# Patient Record
Sex: Female | Born: 1965 | Race: White | Hispanic: No | Marital: Married | Smoking: Former smoker
Health system: Southern US, Community
[De-identification: ages and names within clinical notes are randomized; demographics above are authoritative.]

## PROBLEM LIST (undated history)

## (undated) DIAGNOSIS — F419 Anxiety disorder, unspecified: Secondary | ICD-10-CM

## (undated) DIAGNOSIS — F32A Depression, unspecified: Secondary | ICD-10-CM

## (undated) DIAGNOSIS — E079 Disorder of thyroid, unspecified: Secondary | ICD-10-CM

## (undated) DIAGNOSIS — C801 Malignant (primary) neoplasm, unspecified: Secondary | ICD-10-CM

## (undated) DIAGNOSIS — F329 Major depressive disorder, single episode, unspecified: Secondary | ICD-10-CM

## (undated) DIAGNOSIS — E039 Hypothyroidism, unspecified: Secondary | ICD-10-CM

## (undated) HISTORY — PX: TONSILECTOMY, ADENOIDECTOMY, BILATERAL MYRINGOTOMY AND TUBES: SHX2538

## (undated) HISTORY — PX: TONSILLECTOMY: SUR1361

## (undated) HISTORY — DX: Disorder of thyroid, unspecified: E07.9

---

## 1999-12-28 ENCOUNTER — Ambulatory Visit (HOSPITAL_COMMUNITY): Admission: RE | Admit: 1999-12-28 | Discharge: 1999-12-28 | Payer: Self-pay | Admitting: *Deleted

## 1999-12-28 ENCOUNTER — Encounter: Payer: Self-pay | Admitting: *Deleted

## 2000-01-02 ENCOUNTER — Encounter: Payer: Self-pay | Admitting: *Deleted

## 2000-01-02 ENCOUNTER — Inpatient Hospital Stay (HOSPITAL_COMMUNITY): Admission: AD | Admit: 2000-01-02 | Discharge: 2000-01-02 | Payer: Self-pay | Admitting: Obstetrics and Gynecology

## 2000-01-04 ENCOUNTER — Inpatient Hospital Stay (HOSPITAL_COMMUNITY): Admission: AD | Admit: 2000-01-04 | Discharge: 2000-01-04 | Payer: Self-pay | Admitting: Obstetrics & Gynecology

## 2000-01-17 ENCOUNTER — Other Ambulatory Visit: Admission: RE | Admit: 2000-01-17 | Discharge: 2000-01-17 | Payer: Self-pay | Admitting: Obstetrics and Gynecology

## 2000-07-09 ENCOUNTER — Inpatient Hospital Stay (HOSPITAL_COMMUNITY): Admission: AD | Admit: 2000-07-09 | Discharge: 2000-07-09 | Payer: Self-pay

## 2000-07-10 ENCOUNTER — Inpatient Hospital Stay (HOSPITAL_COMMUNITY): Admission: AD | Admit: 2000-07-10 | Discharge: 2000-07-10 | Payer: Self-pay | Admitting: Obstetrics and Gynecology

## 2000-08-13 ENCOUNTER — Inpatient Hospital Stay (HOSPITAL_COMMUNITY): Admission: AD | Admit: 2000-08-13 | Discharge: 2000-08-13 | Payer: Self-pay | Admitting: Urology

## 2000-09-30 ENCOUNTER — Inpatient Hospital Stay (HOSPITAL_COMMUNITY): Admission: AD | Admit: 2000-09-30 | Discharge: 2000-10-02 | Payer: Self-pay | Admitting: *Deleted

## 2000-10-03 ENCOUNTER — Encounter: Admission: RE | Admit: 2000-10-03 | Discharge: 2001-01-01 | Payer: Self-pay | Admitting: Obstetrics and Gynecology

## 2000-11-06 ENCOUNTER — Other Ambulatory Visit: Admission: RE | Admit: 2000-11-06 | Discharge: 2000-11-06 | Payer: Self-pay | Admitting: Obstetrics and Gynecology

## 2001-10-06 ENCOUNTER — Other Ambulatory Visit: Admission: RE | Admit: 2001-10-06 | Discharge: 2001-10-06 | Payer: Self-pay | Admitting: Obstetrics and Gynecology

## 2002-01-14 ENCOUNTER — Inpatient Hospital Stay (HOSPITAL_COMMUNITY): Admission: AD | Admit: 2002-01-14 | Discharge: 2002-01-14 | Payer: Self-pay | Admitting: Obstetrics and Gynecology

## 2002-01-15 ENCOUNTER — Inpatient Hospital Stay (HOSPITAL_COMMUNITY): Admission: AD | Admit: 2002-01-15 | Discharge: 2002-01-15 | Payer: Self-pay | Admitting: Obstetrics and Gynecology

## 2002-01-23 ENCOUNTER — Inpatient Hospital Stay (HOSPITAL_COMMUNITY): Admission: AD | Admit: 2002-01-23 | Discharge: 2002-01-23 | Payer: Self-pay | Admitting: Obstetrics and Gynecology

## 2002-03-10 ENCOUNTER — Inpatient Hospital Stay (HOSPITAL_COMMUNITY): Admission: AD | Admit: 2002-03-10 | Discharge: 2002-03-10 | Payer: Self-pay | Admitting: Obstetrics and Gynecology

## 2002-04-11 ENCOUNTER — Inpatient Hospital Stay (HOSPITAL_COMMUNITY): Admission: AD | Admit: 2002-04-11 | Discharge: 2002-04-11 | Payer: Self-pay | Admitting: Obstetrics and Gynecology

## 2002-04-18 ENCOUNTER — Inpatient Hospital Stay (HOSPITAL_COMMUNITY): Admission: AD | Admit: 2002-04-18 | Discharge: 2002-04-20 | Payer: Self-pay | Admitting: Obstetrics and Gynecology

## 2002-05-29 ENCOUNTER — Other Ambulatory Visit: Admission: RE | Admit: 2002-05-29 | Discharge: 2002-05-29 | Payer: Self-pay | Admitting: Obstetrics and Gynecology

## 2003-06-10 ENCOUNTER — Other Ambulatory Visit: Admission: RE | Admit: 2003-06-10 | Discharge: 2003-06-10 | Payer: Self-pay | Admitting: Obstetrics and Gynecology

## 2015-10-30 HISTORY — PX: MASTECTOMY: SHX3

## 2016-01-23 ENCOUNTER — Other Ambulatory Visit: Payer: Self-pay | Admitting: Internal Medicine

## 2016-02-17 ENCOUNTER — Other Ambulatory Visit: Payer: Self-pay | Admitting: Obstetrics and Gynecology

## 2016-02-17 DIAGNOSIS — R928 Other abnormal and inconclusive findings on diagnostic imaging of breast: Secondary | ICD-10-CM

## 2016-02-20 ENCOUNTER — Other Ambulatory Visit: Payer: Self-pay | Admitting: Obstetrics and Gynecology

## 2016-02-20 ENCOUNTER — Ambulatory Visit
Admission: RE | Admit: 2016-02-20 | Discharge: 2016-02-20 | Disposition: A | Discharge status: Home / Self Care | Payer: Commercial Managed Care - PPO | Source: Ambulatory Visit | Attending: Obstetrics and Gynecology | Admitting: Obstetrics and Gynecology

## 2016-02-20 DIAGNOSIS — R928 Other abnormal and inconclusive findings on diagnostic imaging of breast: Secondary | ICD-10-CM

## 2016-02-24 ENCOUNTER — Ambulatory Visit
Admission: RE | Admit: 2016-02-24 | Discharge: 2016-02-24 | Disposition: A | Discharge status: Home / Self Care | Payer: Commercial Managed Care - PPO | Source: Ambulatory Visit | Attending: Obstetrics and Gynecology | Admitting: Obstetrics and Gynecology

## 2016-02-24 ENCOUNTER — Other Ambulatory Visit: Payer: Self-pay | Admitting: Obstetrics and Gynecology

## 2016-02-24 DIAGNOSIS — R928 Other abnormal and inconclusive findings on diagnostic imaging of breast: Secondary | ICD-10-CM

## 2016-02-27 ENCOUNTER — Telehealth: Payer: Self-pay

## 2016-02-27 NOTE — Telephone Encounter (Signed)
RESULTS

## 2016-03-01 ENCOUNTER — Telehealth: Payer: Self-pay | Admitting: Hematology

## 2016-03-01 ENCOUNTER — Encounter: Payer: Self-pay | Admitting: Hematology

## 2016-03-01 NOTE — Telephone Encounter (Signed)
Spoke with patient re new patient appointment with YF 5/8 @ 11 am to arrive 10:30 am. Patient demographic and insurance information confirmed.

## 2016-03-05 ENCOUNTER — Ambulatory Visit: Payer: Commercial Managed Care - PPO | Admitting: Hematology

## 2016-03-09 ENCOUNTER — Encounter: Payer: Self-pay | Admitting: Hematology

## 2016-03-09 ENCOUNTER — Ambulatory Visit (HOSPITAL_BASED_OUTPATIENT_CLINIC_OR_DEPARTMENT_OTHER): Payer: Commercial Managed Care - PPO | Admitting: Hematology

## 2016-03-09 VITALS — BP 114/48 | HR 84 | Temp 98.0°F | Resp 18 | Ht 60.0 in | Wt 155.3 lb

## 2016-03-09 DIAGNOSIS — D0511 Intraductal carcinoma in situ of right breast: Secondary | ICD-10-CM | POA: Diagnosis not present

## 2016-03-09 DIAGNOSIS — Z170 Estrogen receptor positive status [ER+]: Secondary | ICD-10-CM

## 2016-03-09 DIAGNOSIS — C50511 Malignant neoplasm of lower-outer quadrant of right female breast: Secondary | ICD-10-CM

## 2016-03-09 NOTE — Progress Notes (Signed)
[place]  [phone] Fax:([phone]) [place] Note   Patient Care Team: Lona Kettle, MD as PCP - General (Family Medicine) 03/09/2016  Referring physician: Dr. Marlou Starks   CHIEF COMPLAINTS/PURPOSE OF CONSULTATION:  Newly diagnosed right breast DCIS    Oncology History   Breast cancer of lower-outer quadrant of right female breast [facility])   Staging form: Breast, AJCC 7th Edition     Clinical stage from 02/24/2016: Stage 0 (Tis (DCIS), N0, cM0(i+)) - Signed by Truitt Merle, MD on 03/09/2016       Breast cancer of lower-outer quadrant of right female breast ([place])   02/20/2016 Mammogram Diagnostic mammogram showed a 7 mm slightly suspicious group of calcifications within the OLQ of R breast, with a second smaller more faint group of calcification spanning 4 mm located 1.5 cm lateral.   02/24/2016 Initial Diagnosis Breast cancer of lower-outer quadrant of right female breast ([place])   02/24/2016 Receptors her2 ER 100%+, PR 75%+   02/24/2016 Initial Biopsy Right breast core needle biopsy showed ductal carcinoma in situ with calcification, intermediate to high-grade.    HISTORY OF PRESENTING ILLNESS:  Katelyn Floyd 50 y.o. female is here because of her newly diagnosed right breast DCIS. She is accompanied by her husband to the clinic today.  This was discovered by screening mammogram. She has been very compliant with screening mammogram, no prior abnormal mammogram findings or history of breast biopsy. She denies any palpable breast mass, skin change or nipple discharge. Diagnostic mammogram and ultrasound on 02/20/2016 showed a 7 mm solid a suspicious group of calcification within the outer lower quadrant of right breast, with a second smaller group of calcification 4 mm located 1.5 cm lateral. She underwent core needle biopsy on 02/24/2016, which showed DCIS, intermediate to high-grade. ER and PR positive.  She is married, with 2 children. No family  history of breast cancer. She is physically active, feels well well, denies any pain or other symptoms. No recent weight loss.  GYN HISTORY  Menarchal: 15 LMP: 05/2015  Contraceptive: no  HRT: she took for a few months  G4P2: she had micarriage and aborsion, 46 boy and 67 yo daughter    MEDICAL HISTORY:  Past Medical History  Diagnosis Date  . Thyroid disease     SURGICAL HISTORY: Past Surgical History  Procedure Laterality Date  . Tonsilectomy, adenoidectomy, bilateral myringotomy and tubes      SOCIAL HISTORY: Social History   Social History  . Marital Status: Married    Spouse Name: N/A  . Number of Children: N/A  . Years of Education: N/A   Occupational History  . Not on file.   Social History Main Topics  . Smoking status: Former Smoker -- 1.00 packs/day for 25 years    Quit date: 09/28/1998  . Smokeless tobacco: Not on file  . Alcohol Use: No  . Drug Use: No  . Sexual Activity: Not on file   Other Topics Concern  . Not on file   Social History Narrative  . 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 Lung cancer Mother 67  . Melanoma Brother     ALLERGIES:  has No Known Allergies.  MEDICATIONS:  Current Outpatient Prescriptions  Medication Sig Dispense Refill  . ALPRAZolam (XANAX) 0.25 MG tablet Take by mouth 3 (three) times daily as needed. 1/2 to 1  tab tid prn    . aspirin 81 MG chewable tablet Chew 81 mg by mouth daily.    Marland Kitchen buPROPion (WELLBUTRIN XL) 300 MG 24 hr tablet Take 300 mg by mouth daily.    . citalopram (CELEXA) 20 MG tablet Take 30 mg by mouth daily.    . Multiple Vitamin (MULTIVITAMIN) capsule Take 1 capsule by mouth daily.    Marland Kitchen thyroid (ARMOUR THYROID) 120 MG tablet Take 30 mg by mouth.    . traZODone (DESYREL) 100 MG tablet Take 150 mg by mouth at bedtime.     No current facility-administered medications for this visit.    REVIEW OF SYSTEMS:    Constitutional: Denies fevers, chills or abnormal night sweats Eyes: Denies blurriness of vision, double vision or watery eyes Ears, nose, mouth, throat, and face: Denies mucositis or sore throat Respiratory: Denies cough, dyspnea or wheezes Cardiovascular: Denies palpitation, chest discomfort or lower extremity swelling Gastrointestinal:  Denies nausea, heartburn or change in bowel habits Skin: Denies abnormal skin rashes Lymphatics: Denies new lymphadenopathy or easy bruising Neurological:Denies numbness, tingling or new weaknesses Behavioral/Psych: Mood is stable, no new changes  All other systems were reviewed with the patient and are negative.  PHYSICAL EXAMINATION: ECOG PERFORMANCE STATUS: 0 - Asymptomatic  Filed Vitals:   03/09/16 1234  BP: 114/48  Pulse: 84  Temp: 98 F (36.7 C)  Resp: 18   Filed Weights   03/09/16 1234  Weight: 155 lb 4.8 oz (70.444 kg)    GENERAL:alert, no distress and comfortable SKIN: skin color, texture, turgor are normal, no rashes or significant lesions EYES: normal, conjunctiva are pink and non-injected, sclera clear OROPHARYNX:no exudate, no erythema and lips, buccal mucosa, and tongue normal  NECK: supple, thyroid normal size, non-tender, without nodularity LYMPH:  no palpable lymphadenopathy in the cervical, axillary or inguinal LUNGS: clear to auscultation and percussion with normal breathing effort HEART: regular rate & rhythm and no murmurs and no lower extremity edema ABDOMEN:abdomen soft, non-tender and normal bowel sounds Musculoskeletal:no cyanosis of digits and no clubbing  PSYCH: alert & oriented x 3 with fluent speech NEURO: no focal motor/sensory deficits Breasts: Breast inspection showed them to be symmetrical with no nipple discharge. Palpation of the breasts and axilla revealed no obvious mass that I could appreciate. No ecchymosis at the biopsy site.   LABORATORY DATA:  I have reviewed the data as listed No results  found for: WBC, HGB, HCT, MCV, PLT No results for input(s): NA, K, CL, CO2, GLUCOSE, BUN, CREATININE, CALCIUM, GFRNONAA, GFRAA, PROT, ALBUMIN, AST, ALT, ALKPHOS, BILITOT, BILIDIR, IBILI in the last 8760 hours.  PATHOLOGY REPORT: Diagnosis 02/24/2016 Breast, right, needle core biopsy, lower outer - DUCTAL CARCINOMA IN SITU WITH CALCIFICATIONS. - SEE COMMENT.  The caricnoma appears intermediate to high grade. Estrogen receptor and progesterone receptor studies will be performed and the results reported separately. The results were called to [facility] on 02/27/16. (JBK:gt, 02/27/16)  Results: IMMUNOHISTOCHEMICAL AND MORPHOMETRIC ANALYSIS PERFORMED MANUALLY Estrogen Receptor: 100%, POSITIVE, STRONG STAINING INTENSITY Progesterone Receptor: 75%, POSITIVE, MODERATE STAINING INTENSITY   RADIOGRAPHIC STUDIES: I have personally reviewed the radiological images as listed and agreed with the findings in the report. Mm Digital Diagnostic Unilat R  02/24/2016  CLINICAL DATA:  Patient status post stereotactic guided core needle biopsy right breast calcifications. EXAM: DIAGNOSTIC RIGHT MAMMOGRAM POST STEREOTACTIC BIOPSY COMPARISON:  Previous exam(s). FINDINGS: Mammographic images were obtained following stereotactic guided biopsy of right breast calcifications, lower outer quadrant. Cylinder-shaped marking clip  is located approximately 1 cm medial to the biopsied calcifications. IMPRESSION: The cylinder-shaped marking clip is located approximately 1 cm medial to the biopsied calcifications. Final Assessment: Post Procedure Mammograms for Marker Placement Electronically Signed   By: Lovey Newcomer M.D.   On: 02/24/2016 09:06   Mm Digital Diagnostic Unilat R  02/20/2016  CLINICAL DATA:  Recall from screening mammography. EXAM: DIGITAL DIAGNOSTIC RIGHT MAMMOGRAM COMPARISON:  02/07/2016, 12/29/2010. ACR Breast Density Category b: There are scattered areas of fibroglandular density. FINDINGS:  There is a small group of heterogeneous calcifications located within the lower outer quadrant of the right breast (posterior 1/3) which span 7 mm. A second, smaller, more faint groups of similar calcifications is present located 1.5 cm lateral and slightly superior to the larger group of calcifications. These span 4 mm. Tissue sampling of the larger group of calcifications is recommended. If this is benign and concordant, I recommend followup diagnostic mammography in 6 months for follow-up of the smaller additional group of calcifications. IMPRESSION: 7 mm slightly suspicious group of calcifications located within the lower outer quadrant of the right breast with a second smaller more faint group of calcifications spanning 4 mm located 1.5 cm lateral and slightly superior to the larger group. Stereotactic biopsy of the larger group calcifications is recommended as discussed above. RECOMMENDATION: Right breast stereotactic biopsy as discussed above. This is scheduled for 02/24/2016 at 8 a.m. I have discussed the findings and recommendations with the patient. Results were also provided in writing at the conclusion of the visit. If applicable, a reminder letter will be sent to the patient regarding the next appointment. BI-RADS CATEGORY  4: Suspicious. Electronically Signed   By: Altamese Cabal M.D.   On: 02/20/2016 11:31   Mm Rt Breast Bx W Loc Dev 1st Lesion Image Bx Spec Stereo Guide  02/27/2016  ADDENDUM REPORT: 02/27/2016 12:56 ADDENDUM: Pathology revealed HIGH GRADE DUCTAL CARCINOMA IN SITU WITH CALCIFICATIONS of the lower outer quadrant of the Right breast. This was found to be concordant by Dr. Lovey Newcomer. Pathology results were discussed with the patient by telephone. The patient reported doing well after the biopsy with tenderness at the site. Post biopsy instructions and care were reviewed and questions were answered. The patient was encouraged to call [facility] for any  additional concerns. Surgical consultation has been arranged with Dr. Autumn Messing at [facility] on Feb 29, 2016. Please note there is an additional separate group of calcifications within the right breast which were discussed on the initial diagnostic evaluation 02/20/2016. Given the recent diagnosis of DCIS, consider sampling of these calcifications or inclusion at surgery. Pathology results reported by Terie Purser, RN on 02/27/2016. Electronically Signed   By: Lovey Newcomer M.D.   On: 02/27/2016 12:56  02/27/2016  CLINICAL DATA:  Indeterminate right breast calcifications. EXAM: RIGHT BREAST STEREOTACTIC CORE NEEDLE BIOPSY COMPARISON:  Previous exams. FINDINGS: The patient and I discussed the procedure of stereotactic-guided biopsy including benefits and alternatives. We discussed the high likelihood of a successful procedure. We discussed the risks of the procedure including infection, bleeding, tissue injury, clip migration, and inadequate sampling. Informed written consent was given. The usual time out protocol was performed immediately prior to the procedure. Using sterile technique and 1% Lidocaine as local anesthetic, under stereotactic guidance, a 9 gauge vacuum assisted core needle biopsy device was used to perform core needle biopsy of calcifications within the lower outer right breast using a lateral approach. Specimen radiograph was  performed showing calcifications. Specimens with calcifications are identified for pathology. At the conclusion of the procedure, a cylinder-shaped tissue marker clip was deployed into the biopsy cavity. Follow-up 2-view mammogram was performed and dictated separately. IMPRESSION: Stereotactic-guided biopsy of right breast calcifications. No apparent complications. Electronically Signed: By: Lovey Newcomer M.D. On: 02/24/2016 09:05    ASSESSMENT & PLAN: 50 year-old perimenopausal woman, presented with screening discovered to DCIS.  1. Right outer lower quadrant  breast DCIS,  Intermediate to high grade, ER+ /PR+ -I discussed her breast imaging and needle biopsy results with patient and her family members in great detail. -She has been seen by breast surgeon Dr. Marlou Starks and decided to have right mastectomy with flap reconstruction. She has seen her plastic surgeon Dr. Ashley Mariner  -Her DCIS will be cured by complete surgical resection. Any form of adjuvant therapy is preventive. -Given her strongly positive/negative ER and PR, young age, intermediate to high-grade DCIS, I do recommend antiestrogen therapy with tamoxifen, which decrease her risk of future breast cancer by ~40%.  The potential side effects of tamoxifen was discussed with patient and her husband also. -We also discussed that biopsy may have sampling limitation, we will review her surgical path, to see if she has any invasive carcinoma components. -We discussed breast cancer surveillance after she completes treatment, Including annual mammogram, breast exam every 6-12 months. -she plan to have her surgery in two month (early July), we discussed the small benefit of starting her on tamoxifen before surgery. Given her noninvasive breast cancer, I think the benefit is minimum, and reassured her that she will likely be fine to wait for 2 months for surgery.   Plan -She will likely have right breast mastectomy and reconstruction in early July. -I'll see her 2-3 weeks after surgery to finalize her chemoprevention with with tamoxifen   All questions were answered. The patient knows to call the clinic with any problems, questions or concerns. I spent 55 minutes counseling the patient face to face. The total time spent in the appointment was 60 minutes and more than 50% was on counseling.     Truitt Merle, MD 03/09/2016 4:50 PM

## 2016-03-13 ENCOUNTER — Telehealth: Payer: Self-pay | Admitting: *Deleted

## 2016-03-13 NOTE — Telephone Encounter (Signed)
Called pt to assess needs and to give navigation resources. Pt relate she is reviewing information on reconstructions and "trying to make a decision". Gave pt encouragement and emotional support. Encourage pt to call with questions. Received verbal understanding. Contact information provided.

## 2016-03-20 ENCOUNTER — Other Ambulatory Visit: Payer: Self-pay | Admitting: General Surgery

## 2016-03-20 DIAGNOSIS — D0511 Intraductal carcinoma in situ of right breast: Secondary | ICD-10-CM

## 2016-04-12 ENCOUNTER — Other Ambulatory Visit: Payer: Self-pay | Admitting: *Deleted

## 2016-05-02 NOTE — H&P (Signed)
  Subjective:    Patient ID: Katelyn Floyd is a 50 y.o. female.  HPI  Here for follow up discussion breast reconstruction prior to planned right NSM.   Presented following screening MMG with heterogenous calcifications. Diagnostic MMG with 7 mm suspicious group of calcifications located within the lower outer quadrant of the right breast with a second group of calcifications spanning 4 mm located 1.5 cm lateral and superior to the larger group. Biopsy with DCIS, ER/PR+. Patient has elected for mastectomy.  Has started tamoxifen, tolerating.  Husband is Automotive engineer at [employer].   Current  36 C happy with this size. Wt stable     Objective:   Physical Exam  Cardiovascular: Normal rate, regular rhythm and normal heart sounds.   Pulmonary/Chest: Effort normal and breath sounds normal.  Abdominal:  Redundant tissue,small panniculus, no hernia  Genitourinary: No breast discharge.  Lymphadenopathy:    She has no axillary adenopathy.   Grade 1 ptosis   SN to nipple R 26 L 26 cm BW R 19 L 19 (chest wall BW 15) Nipple to IMF R 10 L 10  Assessment:     DCIS Right    Plan:      Plan NSM with immediate reconstruction with expander, possible acellular dermis. Reviewed she will lose sensation breast, nipple will not stimulate. Reviewed risks mastectomy flap necrosis including NAC. Reviewed staged nature reconstruction. Reviewed process expansion, drains, hospital stay, post procedure visits and limitations for each procedure. Reviewed implant specific risks rupture, infection requiring exchange or removal device. Discussed use of acellular dermis in breast reconstruction, cadaveric source. Reviewed submuscular placement of expander. Additional risks including but not limited to bleeding, hematoma, seroma, damage to deeper structures, need for additional surgery, asymmetry, unacceptable cosmetic result, DVT/PE, cardiopulmonary complications reviewed.  Patient ultimately  desires autologous reconstruction. We have discussed TRAM vs DIEP, she understands I do not do latter surgery and recommend she consult with surgeon who does. Discussed that it is possible to do flap surgery as immediate reconstruction. However, expressed my preference for staged procedures as if there are any problems with mastectomy flap ischemia/necrosis, would be difficult to recover from as the majority skin would be discarded with autologous reconstruction in setting of NSM. Reviewed that opposite breast will benefit from symmetry procedure in future.   Hold ASA starting today.  Irene Limbo, MD [facility] Plastic & Reconstructive Surgery [phone], pin [phone]

## 2016-05-08 ENCOUNTER — Encounter (HOSPITAL_COMMUNITY)
Admission: RE | Admit: 2016-05-08 | Discharge: 2016-05-08 | Disposition: A | Discharge status: Home / Self Care | Payer: Commercial Managed Care - PPO | Source: Ambulatory Visit | Attending: General Surgery | Admitting: General Surgery

## 2016-05-08 ENCOUNTER — Encounter (HOSPITAL_COMMUNITY): Payer: Self-pay

## 2016-05-08 DIAGNOSIS — D0591 Unspecified type of carcinoma in situ of right breast: Secondary | ICD-10-CM | POA: Diagnosis not present

## 2016-05-08 DIAGNOSIS — F329 Major depressive disorder, single episode, unspecified: Secondary | ICD-10-CM | POA: Diagnosis not present

## 2016-05-08 DIAGNOSIS — E039 Hypothyroidism, unspecified: Secondary | ICD-10-CM | POA: Diagnosis not present

## 2016-05-08 DIAGNOSIS — Z01812 Encounter for preprocedural laboratory examination: Secondary | ICD-10-CM | POA: Diagnosis present

## 2016-05-08 DIAGNOSIS — F419 Anxiety disorder, unspecified: Secondary | ICD-10-CM | POA: Diagnosis not present

## 2016-05-08 HISTORY — DX: Major depressive disorder, single episode, unspecified: F32.9

## 2016-05-08 HISTORY — DX: Depression, unspecified: F32.A

## 2016-05-08 HISTORY — DX: Hypothyroidism, unspecified: E03.9

## 2016-05-08 HISTORY — DX: Anxiety disorder, unspecified: F41.9

## 2016-05-08 LAB — BASIC METABOLIC PANEL
ANION GAP: 7 (ref 5–15)
BUN: 10 mg/dL (ref 6–20)
CALCIUM: 9 mg/dL (ref 8.9–10.3)
CO2: 26 mmol/L (ref 22–32)
Chloride: 103 mmol/L (ref 101–111)
Creatinine, Ser: 0.79 mg/dL (ref 0.44–1.00)
GFR calc Af Amer: >60 mL/min (ref >60)
GLUCOSE: 106 mg/dL — AB (ref 65–99)
Potassium: 3.7 mmol/L (ref 3.5–5.1)
Sodium: 136 mmol/L (ref 135–145)

## 2016-05-08 LAB — HCG, SERUM, QUALITATIVE: PREG SERUM: NEGATIVE

## 2016-05-08 LAB — CBC
HCT: 38.9 % (ref 36.0–46.0)
HEMOGLOBIN: 13 g/dL (ref 12.0–15.0)
MCH: 29.6 pg (ref 26.0–34.0)
MCHC: 33.4 g/dL (ref 30.0–36.0)
MCV: 88.6 fL (ref 78.0–100.0)
PLATELETS: 267 10*3/uL (ref 150–400)
RBC: 4.39 MIL/uL (ref 3.87–5.11)
RDW: 12.3 % (ref 11.5–15.5)
WBC: 6.2 10*3/uL (ref 4.0–10.5)

## 2016-05-08 NOTE — Pre-Procedure Instructions (Addendum)
    Katelyn Floyd  05/08/2016    Your procedure is scheduled on Monday, July 17.  Report to [facility] Admitting at 5:30 A.M.               Your surgery or procedure is scheduled for 7:30 AM   Call this number if you have problems the morning of surgery: [phone]                For any other questions, please call [phone], Monday - Friday 8 AM - 4 PM.   Remember:  Do not eat food or drink liquids after midnight Sunday, July 16.  Take these medicines the morning of surgery with A SIP OF WATER: ARMOUR THYROID, buPROPion (WELLBUTRIN), tamoxifen (NOLVADEX).               May take Xanax if needed.                STOP taking Aspirin, Vitamins.  Do NOT take any Aspirin Products, Ibuprofen (Advil), Naproxen (Aleve), Herbal Medications.   Do not wear jewelry, make-up or nail polish.  Do not wear lotions, powders, or perfumes.    Do not shave 48 hours prior to surgery.    Do not bring valuables to the hospital.  [facility] is not responsible for any belongings or valuables.  Contacts, dentures or bridgework may not be worn into surgery.  Leave your suitcase in the car.  After surgery it may be brought to your room.  For patients admitted to the hospital, discharge time will be determined by your treatment team.  Special instructions: Review  [facility] - Preparing For Surgery.  Please read over the following fact sheets:  Review  [facility] - Preparing For Surgery.

## 2016-05-11 MED ORDER — CEFAZOLIN SODIUM-DEXTROSE 2-4 GM/100ML-% IV SOLN
2.0000 g | INTRAVENOUS | Status: AC
  Administered 2016-05-14: 2 g via INTRAVENOUS
  Filled 2016-05-11: qty 100

## 2016-05-14 ENCOUNTER — Ambulatory Visit (HOSPITAL_COMMUNITY): Payer: Commercial Managed Care - PPO | Admitting: Anesthesiology

## 2016-05-14 ENCOUNTER — Ambulatory Visit (HOSPITAL_COMMUNITY)
Admission: RE | Admit: 2016-05-14 | Discharge: 2016-05-15 | Disposition: A | Discharge status: Home / Self Care | Payer: Commercial Managed Care - PPO | Source: Ambulatory Visit | Attending: General Surgery | Admitting: General Surgery

## 2016-05-14 ENCOUNTER — Encounter (HOSPITAL_COMMUNITY)
Admission: RE | Disposition: A | Discharge status: Home / Self Care | Payer: Self-pay | Source: Ambulatory Visit | Attending: General Surgery

## 2016-05-14 ENCOUNTER — Encounter (HOSPITAL_COMMUNITY): Payer: Self-pay | Admitting: Urology

## 2016-05-14 ENCOUNTER — Encounter (HOSPITAL_COMMUNITY)
Admission: RE | Admit: 2016-05-14 | Discharge: 2016-05-14 | Disposition: A | Discharge status: Home / Self Care | Payer: Commercial Managed Care - PPO | Source: Ambulatory Visit | Attending: General Surgery | Admitting: General Surgery

## 2016-05-14 DIAGNOSIS — E039 Hypothyroidism, unspecified: Secondary | ICD-10-CM | POA: Insufficient documentation

## 2016-05-14 DIAGNOSIS — Z7982 Long term (current) use of aspirin: Secondary | ICD-10-CM | POA: Insufficient documentation

## 2016-05-14 DIAGNOSIS — Z87891 Personal history of nicotine dependence: Secondary | ICD-10-CM | POA: Diagnosis not present

## 2016-05-14 DIAGNOSIS — Z170 Estrogen receptor positive status [ER+]: Secondary | ICD-10-CM | POA: Diagnosis not present

## 2016-05-14 DIAGNOSIS — Z79810 Long term (current) use of selective estrogen receptor modulators (SERMs): Secondary | ICD-10-CM | POA: Insufficient documentation

## 2016-05-14 DIAGNOSIS — Z79899 Other long term (current) drug therapy: Secondary | ICD-10-CM | POA: Insufficient documentation

## 2016-05-14 DIAGNOSIS — D0511 Intraductal carcinoma in situ of right breast: Secondary | ICD-10-CM

## 2016-05-14 DIAGNOSIS — F418 Other specified anxiety disorders: Secondary | ICD-10-CM | POA: Diagnosis not present

## 2016-05-14 HISTORY — PX: NIPPLE SPARING MASTECTOMY/SENTINAL LYMPH NODE BIOPSY/RECONSTRUCTION/PLACEMENT OF TISSUE EXPANDER: SHX6484

## 2016-05-14 HISTORY — PX: BREAST RECONSTRUCTION WITH PLACEMENT OF TISSUE EXPANDER AND FLEX HD (ACELLULAR HYDRATED DERMIS): SHX6295

## 2016-05-14 SURGERY — NIPPLE SPARING MASTECTOMY WITH SENTINAL LYMPH NODE BIOPSY AND  RECONSTRUCTION WITH PLACEMENT OF TISSUE EXPANDER
Anesthesia: General | Site: Breast | Laterality: Right

## 2016-05-14 MED ORDER — METHYLENE BLUE 0.5 % INJ SOLN
INTRAVENOUS | Status: AC
  Filled 2016-05-14: qty 10

## 2016-05-14 MED ORDER — ACETAMINOPHEN 10 MG/ML IV SOLN
1000.0000 mg | INTRAVENOUS | Status: AC
  Administered 2016-05-14: 1000 mg via INTRAVENOUS
  Filled 2016-05-14: qty 100

## 2016-05-14 MED ORDER — FENTANYL CITRATE (PF) 250 MCG/5ML IJ SOLN
INTRAMUSCULAR | Status: AC
  Filled 2016-05-14: qty 5

## 2016-05-14 MED ORDER — TAMOXIFEN CITRATE 10 MG PO TABS
20.0000 mg | ORAL_TABLET | ORAL | Status: DC
  Administered 2016-05-15: 20 mg via ORAL
  Filled 2016-05-14: qty 2

## 2016-05-14 MED ORDER — DEXAMETHASONE SODIUM PHOSPHATE 10 MG/ML IJ SOLN
INTRAMUSCULAR | Status: DC | PRN
  Administered 2016-05-14: 5 mg via INTRAVENOUS

## 2016-05-14 MED ORDER — FENTANYL CITRATE (PF) 100 MCG/2ML IJ SOLN
INTRAMUSCULAR | Status: DC | PRN
  Administered 2016-05-14 (×3): 50 ug via INTRAVENOUS
  Administered 2016-05-14: 100 ug via INTRAVENOUS

## 2016-05-14 MED ORDER — ROCURONIUM BROMIDE 50 MG/5ML IV SOLN
INTRAVENOUS | Status: AC
  Filled 2016-05-14: qty 1

## 2016-05-14 MED ORDER — DIPHENHYDRAMINE HCL 50 MG/ML IJ SOLN
INTRAMUSCULAR | Status: DC | PRN
  Administered 2016-05-14: 25 mg via INTRAVENOUS

## 2016-05-14 MED ORDER — THYROID 60 MG PO TABS
90.0000 mg | ORAL_TABLET | ORAL | Status: DC
  Administered 2016-05-15: 90 mg via ORAL
  Filled 2016-05-14: qty 1

## 2016-05-14 MED ORDER — MEPERIDINE HCL 25 MG/ML IJ SOLN: 6.2500 mg | INTRAMUSCULAR | Status: DC | PRN

## 2016-05-14 MED ORDER — ROCURONIUM BROMIDE 100 MG/10ML IV SOLN
INTRAVENOUS | Status: DC | PRN
  Administered 2016-05-14: 50 mg via INTRAVENOUS
  Administered 2016-05-14 (×2): 10 mg via INTRAVENOUS

## 2016-05-14 MED ORDER — PHENYLEPHRINE 40 MCG/ML (10ML) SYRINGE FOR IV PUSH (FOR BLOOD PRESSURE SUPPORT)
PREFILLED_SYRINGE | INTRAVENOUS | Status: AC
  Filled 2016-05-14: qty 10

## 2016-05-14 MED ORDER — MORPHINE SULFATE (PF) 2 MG/ML IV SOLN
1.0000 mg | INTRAVENOUS | Status: DC | PRN
  Administered 2016-05-15: 2 mg via INTRAVENOUS
  Filled 2016-05-14: qty 1

## 2016-05-14 MED ORDER — DEXAMETHASONE SODIUM PHOSPHATE 10 MG/ML IJ SOLN
INTRAMUSCULAR | Status: AC
  Filled 2016-05-14: qty 1

## 2016-05-14 MED ORDER — THYROID 30 MG PO TABS: 30.0000 mg | ORAL_TABLET | Freq: Every day | ORAL | Status: DC

## 2016-05-14 MED ORDER — BUPROPION HCL ER (XL) 150 MG PO TB24
300.0000 mg | ORAL_TABLET | ORAL | Status: DC
  Administered 2016-05-15: 300 mg via ORAL
  Filled 2016-05-14: qty 2

## 2016-05-14 MED ORDER — KCL IN DEXTROSE-NACL 20-5-0.9 MEQ/L-%-% IV SOLN
INTRAVENOUS | Status: DC
  Administered 2016-05-14: 15:00:00 via INTRAVENOUS
  Filled 2016-05-14: qty 1000

## 2016-05-14 MED ORDER — ONDANSETRON 4 MG PO TBDP
4.0000 mg | ORAL_TABLET | Freq: Four times a day (QID) | ORAL | Status: DC | PRN
  Filled 2016-05-14: qty 1

## 2016-05-14 MED ORDER — SUGAMMADEX SODIUM 200 MG/2ML IV SOLN
INTRAVENOUS | Status: AC
  Filled 2016-05-14: qty 2

## 2016-05-14 MED ORDER — SUGAMMADEX SODIUM 200 MG/2ML IV SOLN
INTRAVENOUS | Status: DC | PRN
  Administered 2016-05-14: 150 mg via INTRAVENOUS

## 2016-05-14 MED ORDER — METOCLOPRAMIDE HCL 5 MG/ML IJ SOLN
10.0000 mg | Freq: Once | INTRAMUSCULAR | Status: AC | PRN
  Administered 2016-05-14: 10 mg via INTRAVENOUS

## 2016-05-14 MED ORDER — LACTATED RINGERS IV SOLN
INTRAVENOUS | Status: DC | PRN
  Administered 2016-05-14 (×3): via INTRAVENOUS

## 2016-05-14 MED ORDER — FAMOTIDINE IN NACL 20-0.9 MG/50ML-% IV SOLN
20.0000 mg | Freq: Two times a day (BID) | INTRAVENOUS | Status: DC
  Administered 2016-05-14 (×2): 20 mg via INTRAVENOUS
  Filled 2016-05-14 (×4): qty 50

## 2016-05-14 MED ORDER — OXYCODONE-ACETAMINOPHEN 5-325 MG PO TABS
ORAL_TABLET | ORAL | Status: AC
  Filled 2016-05-14: qty 1

## 2016-05-14 MED ORDER — ALPRAZOLAM 0.25 MG PO TABS: 0.1250 mg | ORAL_TABLET | Freq: Three times a day (TID) | ORAL | Status: DC | PRN

## 2016-05-14 MED ORDER — MIDAZOLAM HCL 2 MG/2ML IJ SOLN
INTRAMUSCULAR | Status: AC
Start: 2016-05-14 — End: 2016-05-14
  Filled 2016-05-14: qty 2

## 2016-05-14 MED ORDER — METHOCARBAMOL 500 MG PO TABS
500.0000 mg | ORAL_TABLET | Freq: Four times a day (QID) | ORAL | Status: DC | PRN
  Administered 2016-05-15 (×2): 500 mg via ORAL
  Filled 2016-05-14 (×2): qty 1

## 2016-05-14 MED ORDER — ONDANSETRON HCL 4 MG/2ML IJ SOLN
INTRAMUSCULAR | Status: DC | PRN
  Administered 2016-05-14 (×2): 4 mg via INTRAVENOUS

## 2016-05-14 MED ORDER — ONDANSETRON HCL 4 MG/2ML IJ SOLN
4.0000 mg | Freq: Four times a day (QID) | INTRAMUSCULAR | Status: DC | PRN
  Filled 2016-05-14: qty 2

## 2016-05-14 MED ORDER — PHENYLEPHRINE HCL 10 MG/ML IJ SOLN
INTRAMUSCULAR | Status: DC | PRN
  Administered 2016-05-14 (×3): 40 ug via INTRAVENOUS
  Administered 2016-05-14 (×3): 80 ug via INTRAVENOUS
  Administered 2016-05-14 (×2): 40 ug via INTRAVENOUS
  Administered 2016-05-14: 80 ug via INTRAVENOUS

## 2016-05-14 MED ORDER — FENTANYL CITRATE (PF) 100 MCG/2ML IJ SOLN
INTRAMUSCULAR | Status: AC
  Filled 2016-05-14: qty 2

## 2016-05-14 MED ORDER — FENTANYL CITRATE (PF) 100 MCG/2ML IJ SOLN
25.0000 ug | INTRAMUSCULAR | Status: DC | PRN
  Administered 2016-05-14 (×2): 50 ug via INTRAVENOUS

## 2016-05-14 MED ORDER — KETOROLAC TROMETHAMINE 30 MG/ML IJ SOLN
30.0000 mg | Freq: Three times a day (TID) | INTRAMUSCULAR | Status: DC
  Administered 2016-05-14 – 2016-05-15 (×3): 30 mg via INTRAVENOUS
  Filled 2016-05-14 (×3): qty 1

## 2016-05-14 MED ORDER — BUPIVACAINE-EPINEPHRINE (PF) 0.5% -1:200000 IJ SOLN
INTRAMUSCULAR | Status: DC | PRN
  Administered 2016-05-14: 30 mL via PERINEURAL

## 2016-05-14 MED ORDER — CITALOPRAM HYDROBROMIDE 20 MG PO TABS
30.0000 mg | ORAL_TABLET | ORAL | Status: DC
  Administered 2016-05-15: 30 mg via ORAL
  Filled 2016-05-14: qty 2

## 2016-05-14 MED ORDER — CEFAZOLIN IN D5W 1 GM/50ML IV SOLN
1.0000 g | Freq: Three times a day (TID) | INTRAVENOUS | Status: DC
  Administered 2016-05-14 – 2016-05-15 (×3): 1 g via INTRAVENOUS
  Filled 2016-05-14 (×4): qty 50

## 2016-05-14 MED ORDER — CHLORHEXIDINE GLUCONATE 4 % EX LIQD: 1.0000 "application " | Freq: Once | CUTANEOUS | Status: DC

## 2016-05-14 MED ORDER — MIDAZOLAM HCL 5 MG/5ML IJ SOLN: INTRAMUSCULAR | Status: DC | PRN

## 2016-05-14 MED ORDER — OXYCODONE-ACETAMINOPHEN 5-325 MG PO TABS
1.0000 | ORAL_TABLET | ORAL | Status: DC | PRN
  Administered 2016-05-14 – 2016-05-15 (×2): 1 via ORAL
  Administered 2016-05-15 (×2): 2 via ORAL
  Filled 2016-05-14 (×2): qty 2
  Filled 2016-05-14: qty 1

## 2016-05-14 MED ORDER — SODIUM CHLORIDE 0.9 % IV SOLN
INTRAVENOUS | Status: AC
  Administered 2016-05-14: 09:00:00
  Filled 2016-05-14: qty 1

## 2016-05-14 MED ORDER — ONDANSETRON HCL 4 MG/2ML IJ SOLN
INTRAMUSCULAR | Status: AC
  Filled 2016-05-14: qty 4

## 2016-05-14 MED ORDER — ARTIFICIAL TEARS OP OINT
TOPICAL_OINTMENT | OPHTHALMIC | Status: DC | PRN
  Administered 2016-05-14: 1 via OPHTHALMIC

## 2016-05-14 MED ORDER — LACTATED RINGERS IV SOLN: INTRAVENOUS | Status: DC

## 2016-05-14 MED ORDER — 0.9 % SODIUM CHLORIDE (POUR BTL) OPTIME
TOPICAL | Status: DC | PRN
  Administered 2016-05-14: 1000 mL

## 2016-05-14 MED ORDER — PHENYLEPHRINE HCL 10 MG/ML IJ SOLN
10.0000 mg | INTRAMUSCULAR | Status: DC | PRN
  Administered 2016-05-14: 20 ug/min via INTRAVENOUS

## 2016-05-14 MED ORDER — TRAZODONE HCL 150 MG PO TABS
150.0000 mg | ORAL_TABLET | Freq: Every day | ORAL | Status: DC
  Administered 2016-05-14: 150 mg via ORAL
  Filled 2016-05-14: qty 1

## 2016-05-14 MED ORDER — PROPOFOL 10 MG/ML IV BOLUS
INTRAVENOUS | Status: AC
  Filled 2016-05-14: qty 40

## 2016-05-14 MED ORDER — THYROID 60 MG PO TABS: 60.0000 mg | ORAL_TABLET | ORAL | Status: DC

## 2016-05-14 MED ORDER — SODIUM CHLORIDE 0.9 % IJ SOLN
INTRAMUSCULAR | Status: AC
  Filled 2016-05-14: qty 10

## 2016-05-14 MED ORDER — TECHNETIUM TC 99M SULFUR COLLOID FILTERED
1.0000 | Freq: Once | INTRAVENOUS | Status: AC | PRN
  Administered 2016-05-14: 1 via INTRADERMAL

## 2016-05-14 MED ORDER — DIPHENHYDRAMINE HCL 50 MG/ML IJ SOLN
INTRAMUSCULAR | Status: AC
  Filled 2016-05-14: qty 1

## 2016-05-14 MED ORDER — LIDOCAINE 2% (20 MG/ML) 5 ML SYRINGE
INTRAMUSCULAR | Status: AC
  Filled 2016-05-14: qty 5

## 2016-05-14 MED ORDER — LIDOCAINE HCL (CARDIAC) 20 MG/ML IV SOLN
INTRAVENOUS | Status: DC | PRN
  Administered 2016-05-14: 100 mg via INTRAVENOUS

## 2016-05-14 MED ORDER — HEPARIN SODIUM (PORCINE) 5000 UNIT/ML IJ SOLN
5000.0000 [IU] | Freq: Three times a day (TID) | INTRAMUSCULAR | Status: DC
  Administered 2016-05-15: 5000 [IU] via SUBCUTANEOUS
  Filled 2016-05-14: qty 1

## 2016-05-14 MED ORDER — PROPOFOL 10 MG/ML IV BOLUS
INTRAVENOUS | Status: DC | PRN
  Administered 2016-05-14: 150 mg via INTRAVENOUS

## 2016-05-14 MED ORDER — MIDAZOLAM HCL 5 MG/5ML IJ SOLN
INTRAMUSCULAR | Status: DC | PRN
  Administered 2016-05-14: 2 mg via INTRAVENOUS

## 2016-05-14 MED ORDER — METOCLOPRAMIDE HCL 5 MG/ML IJ SOLN
INTRAMUSCULAR | Status: AC
  Filled 2016-05-14: qty 2

## 2016-05-14 SURGICAL SUPPLY — 89 items
APPLIER CLIP 9.375 MED OPEN (MISCELLANEOUS) ×8
BAG DECANTER FOR FLEXI CONT (MISCELLANEOUS) ×4 IMPLANT
BINDER BREAST LRG (GAUZE/BANDAGES/DRESSINGS) ×4 IMPLANT
BINDER BREAST XLRG (GAUZE/BANDAGES/DRESSINGS) IMPLANT
CANISTER SUCTION 2500CC (MISCELLANEOUS) ×8 IMPLANT
CHLORAPREP W/TINT 26ML (MISCELLANEOUS) ×8 IMPLANT
CLIP APPLIE 9.375 MED OPEN (MISCELLANEOUS) ×4 IMPLANT
CLOSURE STERI-STRIP 1/2X4 (GAUZE/BANDAGES/DRESSINGS) ×1
CLSR STERI-STRIP ANTIMIC 1/2X4 (GAUZE/BANDAGES/DRESSINGS) ×3 IMPLANT
CONT SPEC 4OZ CLIKSEAL STRL BL (MISCELLANEOUS) ×12 IMPLANT
COVER PROBE W GEL 5X96 (DRAPES) ×4 IMPLANT
COVER SURGICAL LIGHT HANDLE (MISCELLANEOUS) ×8 IMPLANT
DEVICE DISSECT PLASMABLAD 3.0S (MISCELLANEOUS) ×2 IMPLANT
DRAIN CHANNEL 15F RND FF W/TCR (WOUND CARE) IMPLANT
DRAIN CHANNEL 19F RND (DRAIN) ×4 IMPLANT
DRAPE INCISE IOBAN 66X45 STRL (DRAPES) IMPLANT
DRAPE ORTHO SPLIT 77X108 STRL (DRAPES) ×4
DRAPE PROXIMA HALF (DRAPES) IMPLANT
DRAPE SURG 17X23 STRL (DRAPES) ×4 IMPLANT
DRAPE SURG ORHT 6 SPLT 77X108 (DRAPES) ×4 IMPLANT
DRAPE U-SHAPE 76X120 STRL (DRAPES) ×8 IMPLANT
DRAPE WARM FLUID 44X44 (DRAPE) ×4 IMPLANT
DRSG PAD ABDOMINAL 8X10 ST (GAUZE/BANDAGES/DRESSINGS) ×8 IMPLANT
DRSG TEGADERM 2-3/8X2-3/4 SM (GAUZE/BANDAGES/DRESSINGS) ×8 IMPLANT
DRSG TEGADERM 4X4.75 (GAUZE/BANDAGES/DRESSINGS) ×16 IMPLANT
ELECT BLADE 4.0 EZ CLEAN MEGAD (MISCELLANEOUS) ×8
ELECT CAUTERY BLADE 6.4 (BLADE) ×8 IMPLANT
ELECT COATED BLADE 2.86 ST (ELECTRODE) ×4 IMPLANT
ELECT REM PT RETURN 9FT ADLT (ELECTROSURGICAL) ×8
ELECTRODE BLDE 4.0 EZ CLN MEGD (MISCELLANEOUS) ×4 IMPLANT
ELECTRODE REM PT RTRN 9FT ADLT (ELECTROSURGICAL) ×4 IMPLANT
EVACUATOR SILICONE 100CC (DRAIN) ×4 IMPLANT
GAUZE SPONGE 4X4 12PLY STRL (GAUZE/BANDAGES/DRESSINGS) ×4 IMPLANT
GLOVE BIO SURGEON STRL SZ 6 (GLOVE) ×16 IMPLANT
GLOVE BIO SURGEON STRL SZ7.5 (GLOVE) ×4 IMPLANT
GLOVE BIO SURGEON STRL SZ8 (GLOVE) ×12 IMPLANT
GLOVE BIOGEL PI IND STRL 7.0 (GLOVE) ×2 IMPLANT
GLOVE BIOGEL PI IND STRL 8.5 (GLOVE) ×4 IMPLANT
GLOVE BIOGEL PI INDICATOR 7.0 (GLOVE) ×2
GLOVE BIOGEL PI INDICATOR 8.5 (GLOVE) ×4
GLOVE SURG SS PI 7.0 STRL IVOR (GLOVE) ×4 IMPLANT
GOWN PREVENTION PLUS XLARGE (GOWN DISPOSABLE) ×8 IMPLANT
GOWN STRL REUS W/ TWL LRG LVL3 (GOWN DISPOSABLE) ×10 IMPLANT
GOWN STRL REUS W/TWL LRG LVL3 (GOWN DISPOSABLE) ×10
KIT BASIN OR (CUSTOM PROCEDURE TRAY) ×8 IMPLANT
KIT FILL SYSTEM UNIVERSAL (SET/KITS/TRAYS/PACK) ×4 IMPLANT
KIT ROOM TURNOVER OR (KITS) ×8 IMPLANT
LIGHT WAVEGUIDE WIDE FLAT (MISCELLANEOUS) ×4 IMPLANT
LIQUID BAND (GAUZE/BANDAGES/DRESSINGS) ×12 IMPLANT
MARKER SKIN DUAL TIP RULER LAB (MISCELLANEOUS) ×4 IMPLANT
NEEDLE 18GX1X1/2 (RX/OR ONLY) (NEEDLE) IMPLANT
NEEDLE FILTER BLUNT 18X 1/2SAF (NEEDLE)
NEEDLE FILTER BLUNT 18X1 1/2 (NEEDLE) IMPLANT
NEEDLE HYPO 25GX1X1/2 BEV (NEEDLE) IMPLANT
NEEDLE INFUSION SET 21GA (NEEDLE) ×4 IMPLANT
NS IRRIG 1000ML POUR BTL (IV SOLUTION) ×12 IMPLANT
PACK GENERAL/GYN (CUSTOM PROCEDURE TRAY) ×8 IMPLANT
PAD ARMBOARD 7.5X6 YLW CONV (MISCELLANEOUS) ×8 IMPLANT
PIN SAFETY STERILE (MISCELLANEOUS) ×4 IMPLANT
PLASMABLADE 3.0S (MISCELLANEOUS) ×4
SET ASEPTIC TRANSFER (MISCELLANEOUS) ×4 IMPLANT
SOLUTION BETADINE 4OZ (MISCELLANEOUS) ×4 IMPLANT
SPECIMEN JAR X LARGE (MISCELLANEOUS) ×4 IMPLANT
SPONGE GAUZE 4X4 12PLY STER LF (GAUZE/BANDAGES/DRESSINGS) ×4 IMPLANT
SPONGE LAP 18X18 X RAY DECT (DISPOSABLE) ×4 IMPLANT
STAPLER VISISTAT 35W (STAPLE) ×4 IMPLANT
SUT ETHILON 2 0 FS 18 (SUTURE) ×4 IMPLANT
SUT MNCRL 3 0 RB1 (SUTURE) IMPLANT
SUT MNCRL AB 4-0 PS2 18 (SUTURE) ×4 IMPLANT
SUT MON AB 5-0 PS2 18 (SUTURE) IMPLANT
SUT MONOCRYL 3 0 RB1 (SUTURE)
SUT SILK 2 0 SH (SUTURE) IMPLANT
SUT VIC AB 3-0 54X BRD REEL (SUTURE) ×2 IMPLANT
SUT VIC AB 3-0 BRD 54 (SUTURE) ×2
SUT VIC AB 3-0 SH 18 (SUTURE) ×4 IMPLANT
SUT VIC AB 3-0 SH 27 (SUTURE) ×6
SUT VIC AB 3-0 SH 27X BRD (SUTURE) ×6 IMPLANT
SUT VIC AB 4-0 PS2 27 (SUTURE) ×4 IMPLANT
SUT VICRYL 3 0 (SUTURE) IMPLANT
SUT VICRYL 4-0 PS2 18IN ABS (SUTURE) IMPLANT
SYR BULB IRRIGATION 50ML (SYRINGE) ×4 IMPLANT
SYR CONTROL 10ML LL (SYRINGE) IMPLANT
TAPE STRIPS DRAPE STRL (GAUZE/BANDAGES/DRESSINGS) ×4 IMPLANT
TISSUE EXPANDER MX 500CC (Prosthesis & Implant Plastic) ×4 IMPLANT
TOWEL OR 17X24 6PK STRL BLUE (TOWEL DISPOSABLE) ×8 IMPLANT
TOWEL OR 17X26 10 PK STRL BLUE (TOWEL DISPOSABLE) ×8 IMPLANT
TRAY FOLEY CATH 16FRSI W/METER (SET/KITS/TRAYS/PACK) IMPLANT
TUBE CONNECTING 12'X1/4 (SUCTIONS) ×2
TUBE CONNECTING 12X1/4 (SUCTIONS) ×6 IMPLANT

## 2016-05-14 NOTE — Op Note (Signed)
Operative Note   DATE OF OPERATION: 7.17.17  LOCATION: [facility]- observation  SURGICAL DIVISION: Plastic Surgery  PREOPERATIVE DIAGNOSES:  1. Right breast DCIS  POSTOPERATIVE DIAGNOSES:  same  PROCEDURE:  1. Right breast reconstruction with tissue expander  SURGEON: Irene Limbo MD MBA  ASSISTANT: none  ANESTHESIA:  General.   EBL: 75 ml for entire case  COMPLICATIONS: None immediate.   INDICATIONS FOR PROCEDURE:  The patient, Katelyn Floyd, is a 50 y.o. female born on 1965/12/03, is here for immediate reconstruction with tissue expander following nipple sparing mastectomy.   FINDINGS: Natrelle 133MX-13-T 500 ml tissue expander placed, initial fill volume 240 ml. SN [id number]  DESCRIPTION OF PROCEDURE:  The patient's operative site was marked with the patient in the preoperative area. The patient was taken to the operating room. SCDs were placed and IV antibiotics were given. The patient's operative site was prepped and draped in a sterile fashion. A time out was performed and all information was confirmed to be correct. Following completion of mastectomy, the pectoralis major muscle was elevated continuous with abdominal wall fascia to inframammary fold. The serratus fascia and muscle were elevated laterally. A 19 Fr drain was placed in subcutaneous position laterally and submuscular position medialy and secured to skin with 2-0 nylon. The cavity was irrigated with solution containing Ancef, genatmicin, and bacitracin. Hemostasis was ensured. Cavity irrigated with Betadine. The tissue expander was prepared and placed in submuscular position. The expander was secured to chest wall with a 3-0 vicryl. The pectoralis was secured to elevated serratus fascia and muscle with interrupted figure of eight 3-0 vicryl. The incision was closed with 3-0 vicryl in fascial layer and 4-0 vicryl in dermis. Skin closure completed with 4-0 monocryl subcuticular and tissue adhesive. The port was  accessed and filled to 240 ml. The patient was brought to upright position and the skin flaps were redraped so that NAC was symmetric from the midline. Transparent, adherent dressings applied. Dry dressing and breast binder applied. The patient was allowed to wake from anesthesia, extubated and taken to the recovery room in satisfactory condition.   SPECIMENS:none  DRAINS: 19 Fr JP in right reconstructed breast  Irene Limbo, MD [facility] Plastic & Reconstructive Surgery [phone], pin [phone]

## 2016-05-14 NOTE — Interval H&P Note (Signed)
History and Physical Interval Note:  05/14/2016 6:57 AM  Katelyn Floyd  has presented today for surgery, with the diagnosis of right breast DCIS  The various methods of treatment have been discussed with the patient and family. After consideration of risks, benefits and other options for treatment, the patient has consented to  Procedure(s): RIGHT NIPPLE SPARING MASTECTOMY WITH SENTINAL LYMPH NODE BIOPSY  (Right) RIGHT BREAST RECONSTRUCTION WITH PLACEMENT OF TISSUE EXPANDER AND POSSIBLE ACELLULAR HYDRATED DERMIS (Right) as a surgical intervention .  The patient's history has been reviewed, patient examined, no change in status, stable for surgery.  I have reviewed the patient's chart and labs.  Questions were answered to the patient's satisfaction.     Rodolfo Notaro

## 2016-05-14 NOTE — Anesthesia Preprocedure Evaluation (Addendum)
Anesthesia Evaluation  Patient identified by MRN, date of birth, ID band Patient awake    Reviewed: Allergy & Precautions, NPO status , Patient's Chart, lab work & pertinent test results  History of Anesthesia Complications Negative for: history of anesthetic complications  Airway Mallampati: II  TM Distance: >3 FB Neck ROM: Full    Dental no notable dental hx. (+) Caps, Teeth Intact, Dental Advisory Given   Pulmonary former smoker,    Pulmonary exam normal breath sounds clear to auscultation       Cardiovascular negative cardio ROS Normal cardiovascular exam Rhythm:Regular Rate:Normal     Neuro/Psych PSYCHIATRIC DISORDERS Anxiety Depression negative neurological ROS  negative psych ROS   GI/Hepatic negative GI ROS, Neg liver ROS,   Endo/Other  Hypothyroidism   Renal/GU negative Renal ROS  negative genitourinary   Musculoskeletal negative musculoskeletal ROS (+)   Abdominal   Peds negative pediatric ROS (+)  Hematology negative hematology ROS (+)   Anesthesia Other Findings   Reproductive/Obstetrics negative OB ROS                           Anesthesia Physical Anesthesia Plan  ASA: II  Anesthesia Plan: General   Post-op Pain Management: GA combined w/ Regional for post-op pain   Induction: Intravenous  Airway Management Planned: Oral ETT  Additional Equipment:   Intra-op Plan:   Post-operative Plan: Extubation in OR  Informed Consent: I have reviewed the patients History and Physical, chart, labs and discussed the procedure including the risks, benefits and alternatives for the proposed anesthesia with the patient or authorized representative who has indicated his/her understanding and acceptance.   Dental advisory given  Plan Discussed with: CRNA  Anesthesia Plan Comments:         Anesthesia Quick Evaluation

## 2016-05-14 NOTE — Anesthesia Procedure Notes (Addendum)
Anesthesia Regional Block:  Pectoralis block  Pre-Anesthetic Checklist: ,, timeout performed, Correct Patient, Correct Site, Correct Laterality, Correct Procedure, Correct Position, site marked, Risks and benefits discussed,  Surgical consent,  Pre-op evaluation,  At surgeon's request and post-op pain management  Laterality: Right and Upper  Prep: Maximum Sterile Barrier Precautions used and chloraprep       Needles:  Injection technique: Single-shot  Needle Type: Echogenic Stimulator Needle     Needle Length: 10cm 10 cm Needle Gauge: 21 and 21 G    Additional Needles:  Procedures: ultrasound guided (picture in chart) Pectoralis block Narrative:  Injection made incrementally with aspirations every 5 mL.  Performed by: Personally   Additional Notes: Risks, benefits and alternative to block explained extensively.  Patient tolerated procedure well, without complications.   Procedure Name: Intubation Date/Time: 05/14/2016 7:50 AM Performed by: Jenne Campus Pre-anesthesia Checklist: Patient identified, Emergency Drugs available, Suction available and Patient being monitored Patient Re-evaluated:Patient Re-evaluated prior to inductionOxygen Delivery Method: Circle System Utilized Preoxygenation: Pre-oxygenation with 100% oxygen Intubation Type: IV induction Ventilation: Mask ventilation without difficulty Laryngoscope Size: Miller and 2 Grade View: Grade I Tube type: Oral Tube size: 7.0 mm Number of attempts: 1 Airway Equipment and Method: Stylet Placement Confirmation: ETT inserted through vocal cords under direct vision,  positive ETCO2 and breath sounds checked- equal and bilateral Secured at: 19 cm Tube secured with: Tape Dental Injury: Teeth and Oropharynx as per pre-operative assessment

## 2016-05-14 NOTE — Transfer of Care (Signed)
Immediate Anesthesia Transfer of Care Note  Patient: Katelyn Floyd  Procedure(s) Performed: Procedure(s): RIGHT NIPPLE SPARING MASTECTOMY WITH SENTINAL LYMPH NODE BIOPSY  (Right) RIGHT BREAST RECONSTRUCTION WITH PLACEMENT OF TISSUE EXPANDER AND POSSIBLE ACELLULAR HYDRATED DERMIS (Right)  Patient Location: PACU  Anesthesia Type:General  Level of Consciousness: awake, alert  and oriented  Airway & Oxygen Therapy: Patient Spontanous Breathing and Patient connected to nasal cannula oxygen  Post-op Assessment: Report given to RN and Post -op Vital signs reviewed and stable  Post vital signs: Reviewed and stable  Last Vitals:  Filed Vitals:   05/14/16 0613  BP: 102/41  Pulse: 71  Temp: 36.9 C  Resp: 20    Last Pain: There were no vitals filed for this visit.       Complications: No apparent anesthesia complications

## 2016-05-14 NOTE — Op Note (Signed)
05/14/2016  9:54 AM  PATIENT:  Katelyn Floyd  50 y.o. female  PRE-OPERATIVE DIAGNOSIS:  right breast DCIS  POST-OPERATIVE DIAGNOSIS:  right breast DCIS  PROCEDURE:  Procedure(s): RIGHT NIPPLE SPARING MASTECTOMY WITH SENTINAL LYMPH NODE BIOPSY  (Right)  SURGEON:  Surgeon(s) and Role: Panel 1:    * Jovita Kussmaul, MD - Primary  Panel 2:    * Irene Limbo, MD - Primary  PHYSICIAN ASSISTANT:   ASSISTANTS: Dr. Ashley Mariner   ANESTHESIA:   general  EBL:  Total I/O In: 1000 [I.V.:1000] Out: 87 [Urine:260]  BLOOD ADMINISTERED:none  DRAINS: none   LOCAL MEDICATIONS USED:  NONE  SPECIMEN:  Source of Specimen:  right nipple sparing mastectomy and retronipple tissue  DISPOSITION OF SPECIMEN:  PATHOLOGY  COUNTS:  YES  TOURNIQUET:  * No tourniquets in log *  DICTATION: .Dragon Dictation   After informed consent was obtained the patient was brought to the operating room and placed in the supine position on the operating room table. After adequate induction of general anesthesia the patient's bilateral chest, breast, and axillary areas were prepped with ChloraPrep, allowed to dry, and draped in usual sterile manner. An appropriate timeout was performed. Earlier in the day the patient underwent injection of 1 mCi of technetium sulfur colloid in the subareolar position on the right. The neoprobe was set to technetium in the right axilla was examined. A hot spot was identified. This area was marked on the skin. An inframammary incision was then made with a 10 blade knife. The incision was carried through the skin and subcutaneous tissue sharply with the plasma blade. The dissection was carried along the lower edge of the breast until the chest wall was identified. The breast was then removed from the pectoralis muscle with the pectoralis fascia sharply with the plasma blade. This dissection was carried all the way to the superior and lateral edge of the breast.Next the breast was  separated from the subcutaneous tissue Of the skin by combination of sharp dissection with the plasma blade as well as sharp dissection with Metzenbaum scissors. Once the dissection reached the nipple, Some tissue was removed sharply from the backside of the nipple with a 10 blade knife. This was sent for frozen section and the pathologist felt that it appeared benign. This dissection was continued sharply until the superior edge of the breast was identified and the dissection reached the chest wall. Once this was accomplished the breast tissue was able to be removed from the patient. It was marked with a short double stitch at the location of the nipple, a long single stitch lateral, and a short single stitch superiorly. The breast tissue was then sent to pathology for further evaluation. The [employer] retractors were used for this portion of the case. At this point the right axilla was examined with the retractors and the neoprobe 2 hot spots were identified. Sharp dissection with the plasma blade revealed 2 separate lymph nodes. These were excised sharply with the plasma blade and the lymphatics were controlled with clips. These were sent as sentinel nodes #1 and 2. Some additional right axillary tissue that was removed during the dissection was sent separately. Hemostasis was achieved using the plasma blade. The wound was then irrigated with copious amounts of saline. At this point the skin flaps appeared viable and the wound was clean. At this point the operation was turned over to Dr. Ashley Mariner for the reconstruction portion of the case. Her portion will be dictated separately. The  patient has tolerated the procedure well so far. All needle sponge and instrument counts were correct. The patient was in stable condition.  PLAN OF CARE: Admit for overnight observation  PATIENT DISPOSITION:  PACU - hemodynamically stable.   Delay start of Pharmacological VTE agent (>24hrs) due to surgical blood loss or risk of  bleeding: no

## 2016-05-14 NOTE — H&P (Signed)
Katelyn Floyd  Location: [facility] Patient #: [id number] DOB: December 19, 1965 Married / Language: English / Race: White Female   History of Present Illness  Patient words: discuss sx.  The patient is a 50 year old female who presents for a follow-up for Breast cancer. The patient appears to have 2 small areas of DCIS in the lower outer quadrant of the right breast. After discussing all the different options for treatment she has decided on right nipple sparing mastectomy with sentinel node mapping and reconstruction. The reconstruction will be done by Dr. Ashley Mariner. She returns today with questions about the upcoming surgery. She would like to schedule the surgery in July. She was ER and PR positive.   Allergies  No Known Drug Allergies  Medication History  TraZODone HCl (100MG  Tablet, Oral) Active. Citalopram Hydrobromide (20MG  Tablet, Oral) Active. BuPROPion HCl ER (XL) (300MG  Tablet ER 24HR, Oral) Active. Synthroid (100MCG Tablet, Oral) Active. Aspirin (81MG  Tablet, Oral) Active. Multi Vitamin Daily (Oral) Active. Medications Reconciled    Review of Systems  General Not Present- Appetite Loss, Chills, Fatigue, Fever, Night Sweats, Weight Gain and Weight Loss. Skin Not Present- Change in Wart/Mole, Dryness, Hives, Jaundice, New Lesions, Non-Healing Wounds, Rash and Ulcer. HEENT Present- Wears glasses/contact lenses. Not Present- Earache, Hearing Loss, Hoarseness, Nose Bleed, Oral Ulcers, Ringing in the Ears, Seasonal Allergies, Sinus Pain, Sore Throat, Visual Disturbances and Yellow Eyes. Respiratory Not Present- Bloody sputum, Chronic Cough, Difficulty Breathing, Snoring and Wheezing. Breast Present- Breast Mass. Not Present- Breast Pain, Nipple Discharge and Skin Changes. Cardiovascular Not Present- Chest Pain, Difficulty Breathing Lying Down, Leg Cramps, Palpitations, Rapid Heart Rate, Shortness of Breath and Swelling of Extremities. Gastrointestinal  Not Present- Abdominal Pain, Bloating, Bloody Stool, Change in Bowel Habits, Chronic diarrhea, Constipation, Difficulty Swallowing, Excessive gas, Gets full quickly at meals, Hemorrhoids, Indigestion, Nausea, Rectal Pain and Vomiting. Female Genitourinary Not Present- Frequency, Nocturia, Painful Urination, Pelvic Pain and Urgency. Musculoskeletal Not Present- Back Pain, Joint Pain, Joint Stiffness, Muscle Pain, Muscle Weakness and Swelling of Extremities. Neurological Not Present- Decreased Memory, Fainting, Headaches, Numbness, Seizures, Tingling, Tremor, Trouble walking and Weakness. Psychiatric Present- Anxiety and Depression. Not Present- Bipolar, Change in Sleep Pattern, Fearful and Frequent crying. Endocrine Present- Hot flashes. Not Present- Cold Intolerance, Excessive Hunger, Hair Changes, Heat Intolerance and New Diabetes. Hematology Not Present- Easy Bruising, Excessive bleeding, Gland problems, HIV and Persistent Infections.  Vitals  Weight: 159.4 lb Height: 60in Body Surface Area: 1.7 m Body Mass Index: 31.13 kg/m  Temp.: 98.37F(Oral)  Pulse: 87 (Regular)  BP: 114/70 (Sitting, Left Arm, Standard)       Physical Exam  General Mental Status-Alert. General Appearance-Consistent with stated age. Hydration-Well hydrated. Voice-Normal.  Head and Neck Head-normocephalic, atraumatic with no lesions or palpable masses. Trachea-midline. Thyroid Gland Characteristics - normal size and consistency.  Eye Eyeball - Bilateral-Extraocular movements intact. Sclera/Conjunctiva - Bilateral-No scleral icterus.  Chest and Lung Exam Chest and lung exam reveals -quiet, even and easy respiratory effort with no use of accessory muscles and on auscultation, normal breath sounds, no adventitious sounds and normal vocal resonance. Inspection Chest Wall - Normal. Back - normal.  Breast Note: There is a small bruise in the lower outer quadrant of the right  breast. There is no palpable mass in either breast. There is no palpable axillary, supraclavicular, or cervical lymphadenopathy.   Cardiovascular Cardiovascular examination reveals -normal heart sounds, regular rate and rhythm with no murmurs and normal pedal pulses bilaterally.  Abdomen Inspection Inspection of the  abdomen reveals - No Hernias. Skin - Scar - no surgical scars. Palpation/Percussion Palpation and Percussion of the abdomen reveal - Soft, Non Tender, No Rebound tenderness, No Rigidity (guarding) and No hepatosplenomegaly. Auscultation Auscultation of the abdomen reveals - Bowel sounds normal.  Neurologic Neurologic evaluation reveals -alert and oriented x 3 with no impairment of recent or remote memory. Mental Status-Normal.  Musculoskeletal Normal Exam - Left-Upper Extremity Strength Normal and Lower Extremity Strength Normal. Normal Exam - Right-Upper Extremity Strength Normal and Lower Extremity Strength Normal.  Lymphatic Head & Neck  General Head & Neck Lymphatics: Bilateral - Description - Normal. Axillary  General Axillary Region: Bilateral - Description - Normal. Tenderness - Non Tender. Femoral & Inguinal  Generalized Femoral & Inguinal Lymphatics: Bilateral - Description - Normal. Tenderness - Non Tender.    Assessment & Plan DUCTAL CARCINOMA IN SITU (DCIS) OF RIGHT BREAST (D05.11) Impression: The patient has an area of DCIS in the right breast. She has decided on nipple sparing mastectomy with reconstruction. She would like to schedule this in July. Because of the amount of time between diagnosis and surgery we will go ahead and start her on tamoxifen since her cancer was hormone receptor positive. I will plan for a right nipple sparing mastectomy and sentinel node mapping with reconstruction by Dr. Ashley Mariner Current Plans Started Tamoxifen Citrate 20MG , 1 (one) Tablet daily, #30, 03/20/2016, Ref. x1.

## 2016-05-14 NOTE — Interval H&P Note (Signed)
History and Physical Interval Note:  05/14/2016 7:15 AM  Katelyn Floyd  has presented today for surgery, with the diagnosis of right breast DCIS  The various methods of treatment have been discussed with the patient and family. After consideration of risks, benefits and other options for treatment, the patient has consented to  Procedure(s): RIGHT NIPPLE SPARING MASTECTOMY WITH SENTINAL LYMPH NODE BIOPSY  (Right) RIGHT BREAST RECONSTRUCTION WITH PLACEMENT OF TISSUE EXPANDER AND POSSIBLE ACELLULAR HYDRATED DERMIS (Right) as a surgical intervention .  The patient's history has been reviewed, patient examined, no change in status, stable for surgery.  I have reviewed the patient's chart and labs.  Questions were answered to the patient's satisfaction.     TOTH III,PAUL S

## 2016-05-14 NOTE — Progress Notes (Signed)
Pt admitted to unit s/p right breast reconstruction with tissue expander by Dr Marlou Starks. Alert, oriented and able to voice needs with no concerns expressed at this time. Right JP drain in place.

## 2016-05-15 ENCOUNTER — Encounter (HOSPITAL_COMMUNITY): Payer: Self-pay | Admitting: General Surgery

## 2016-05-15 ENCOUNTER — Other Ambulatory Visit: Payer: Self-pay | Admitting: Hematology

## 2016-05-15 DIAGNOSIS — D0511 Intraductal carcinoma in situ of right breast: Secondary | ICD-10-CM | POA: Diagnosis not present

## 2016-05-15 MED ORDER — OXYCODONE-ACETAMINOPHEN 5-325 MG PO TABS: 1.0000 | ORAL_TABLET | ORAL | Status: DC | PRN

## 2016-05-15 MED ORDER — SULFAMETHOXAZOLE-TRIMETHOPRIM 800-160 MG PO TABS: 1.0000 | ORAL_TABLET | Freq: Two times a day (BID) | ORAL | Status: DC

## 2016-05-15 MED ORDER — METHOCARBAMOL 500 MG PO TABS: 500.0000 mg | ORAL_TABLET | Freq: Three times a day (TID) | ORAL | Status: DC | PRN

## 2016-05-15 NOTE — Discharge Summary (Signed)
Physician Discharge Summary  Patient ID: Katelyn Floyd MRN: [id number] DOB/AGE: 1966/10/18 50 y.o.  Admit date: 05/14/2016 Discharge date: 05/15/2016  Admission Diagnoses: DCIS right breast Discharge Diagnoses:  Active Problems:   Ductal carcinoma in situ (DCIS) of right breast  Discharged Condition: stable  Hospital Course: Post operatively patient able to ambulate with minimal assist. Tolerated oral pain medication and diet. Instructed on drain care and bathing.   Treatments: surgery: right nipple sparing mastectomy, sentinel lymph node, tissue expander reconstruction  Discharge Exam: Blood pressure 112/62, pulse 72, temperature 98.2 F (36.8 C), temperature source Oral, resp. rate 16, height 5' (1.524 m), weight 71.668 kg (158 lb), SpO2 96 %. Incision/Wound: right chest flat, Tegaderms in place, drain watery serosanguinous, developing ecchymoses flaps, no drainage beneath tegaderms  Disposition:   Discharge Instructions    Call MD for:  redness, tenderness, or signs of infection (pain, swelling, bleeding, redness, odor or green/yellow discharge around incision site)    Complete by:  As directed      Call MD for:  temperature >100.5    Complete by:  As directed      Discharge instructions    Complete by:  As directed   Ok to remove dressings and shower am 7.19.17. Soap and water ok, pat incisions dry. No creams or ointments over incisions. Do not let drains dangle in shower, attach to lanyard or similar.Strip and record drains twice daily and bring log to clinic visit.  Breast binder or soft compression bra all other times.  Ok to raise arms above shoulders for bathing and dressing.  No house yard work or exercise until cleared by MD.     Driving Restrictions    Complete by:  As directed   No driving while taking narcotics     Lifting restrictions    Complete by:  As directed   No lifting greater than 5 lbs     Resume previous diet    Complete by:  As directed             Medication List    STOP taking these medications        aspirin EC 81 MG tablet      TAKE these medications        ALPRAZolam 0.25 MG tablet  Commonly known as:  XANAX  Take 0.125-0.25 mg by mouth 3 (three) times daily as needed for anxiety.     ARMOUR THYROID 120 MG tablet  Generic drug:  thyroid  Take 30 mg by mouth.     ARMOUR THYROID 30 MG tablet  Generic drug:  thyroid  Take 60-90 mg by mouth See admin instructions. 60 mg on Mon, Wed, and Fri.  90 mg all other days with breakfast     buPROPion 300 MG 24 hr tablet  Commonly known as:  WELLBUTRIN XL  Take 300 mg by mouth every morning.     citalopram 20 MG tablet  Commonly known as:  CELEXA  Take 30 mg by mouth every morning.     methocarbamol 500 MG tablet  Commonly known as:  ROBAXIN  Take 1 tablet (500 mg total) by mouth every 8 (eight) hours as needed for muscle spasms.     multivitamin capsule  Take 1 capsule by mouth every morning.     oxyCODONE-acetaminophen 5-325 MG tablet  Commonly known as:  PERCOCET/ROXICET  Take 1-2 tablets by mouth every 4 (four) hours as needed for moderate pain.     sulfamethoxazole-trimethoprim 800-160 MG  tablet  Commonly known as:  BACTRIM DS,SEPTRA DS  Take 1 tablet by mouth 2 (two) times daily.     tamoxifen 20 MG tablet  Commonly known as:  NOLVADEX  Take 20 mg by mouth every morning.     traZODone 100 MG tablet  Commonly known as:  DESYREL  Take 150 mg by mouth at bedtime.           Follow-up Information    Follow up with [facility], Arnoldo Hooker, MD In 1 week.   Specialty:  Plastic Surgery   Why:  as scheduled   Contact information:   [place] [address] [phone]       Follow up with Merrie Roof, MD. Schedule an appointment as soon as possible for a visit in 2 weeks.   Specialty:  General Surgery   Contact information:   [address] [place] [address] [phone]       Signed: [name] 05/15/2016, 9:07 AM

## 2016-05-15 NOTE — Anesthesia Postprocedure Evaluation (Signed)
Anesthesia Post Note  Patient: Katelyn Floyd  Procedure(s) Performed: Procedure(s) (LRB): RIGHT NIPPLE SPARING MASTECTOMY WITH SENTINAL LYMPH NODE BIOPSY  (Right) RIGHT BREAST RECONSTRUCTION WITH PLACEMENT OF TISSUE EXPANDER AND POSSIBLE ACELLULAR HYDRATED DERMIS (Right)  Patient location during evaluation: PACU Anesthesia Type: General Level of consciousness: awake, awake and alert and oriented Pain management: pain level controlled Vital Signs Assessment: post-procedure vital signs reviewed and stable Respiratory status: spontaneous breathing, nonlabored ventilation and respiratory function stable Cardiovascular status: blood pressure returned to baseline Anesthetic complications: no    Last Vitals:  Filed Vitals:   05/14/16 2032 05/15/16 0532  BP: 109/53 111/58  Pulse: 95 95  Temp: 36.9 C 36.8 C  Resp: 19 19    Last Pain:  Filed Vitals:   05/15/16 0533  PainSc: 8                  Debera Sterba COKER

## 2016-05-15 NOTE — Progress Notes (Signed)
Pt discharged to home.  Discharge instructions explained to pt.  Pt has no questions at the time of discharge.  Pt states she has all belongings.  IV removed by pt's husband.  Pt taken off unit via wheelchair by staff.

## 2016-05-19 ENCOUNTER — Telehealth: Payer: Self-pay | Admitting: Hematology

## 2016-05-19 NOTE — Telephone Encounter (Signed)
Lvm advising appt chgd from 8/7 to 8/14 @ 1.15pm per md req.

## 2016-05-24 ENCOUNTER — Encounter (HOSPITAL_BASED_OUTPATIENT_CLINIC_OR_DEPARTMENT_OTHER): Payer: Self-pay | Admitting: *Deleted

## 2016-05-24 NOTE — H&P (Signed)
  Subjective:    Patient ID: Katelyn Floyd is a 50 y.o. female.  HPI  1.5 weeks post op nipple sparing mastectomy with immediate expander placement.  Presented following screening MMG with heterogenous calcifications. Diagnostic MMG with 7 mm suspicious group of calcifications located within the lower outer quadrant of the right breast with a second group of calcifications spanning 4 mm located 1.5 cm lateral and superior to the larger group. Biopsy with DCIS, ER/PR+. On tamoxifen. Patient elected for mastectomy, final pathology   DCIS spanning 1.0 cm, margins clear 0/3 SLN.   Prior 36 C happy with this size. Mastectomy right 878 g  Review of Systems     Objective:   Physical Exam  CV: normal heart sounds PULM: clear to auscultation Chest: nipple with half dry black eschar, mastectomy flap at IMF scar also black eschar. Remainder of areola and lower pole skin with ischemic changes. Drain serosanguinous, no cellulitis chest  Assessment:     DCIS Right S/p right NSM, TE reconstruction    Plan:     Unfortunately requires debridement mastectomy flap. Reviewed the two black areas are not viable; will wait for remainder of skin to declare itself. Counseled patient on options to debride mastectomy flap and possibly replace expander if exposed and try to continue with expansion vs debride flap and remove expander. I counseled if the entire area of concern is not viable then I would recommend removal of expander entirely and allow to heal prior to proceeding with reconstruction. Plan for OP surgery, will continue to have drain.   Natrelle [id number] 500 ml tissue expander placed, initial fill volume 240 ml.   Irene Limbo, MD [facility] Plastic & Reconstructive Surgery [phone], pin [phone]

## 2016-05-25 ENCOUNTER — Encounter (HOSPITAL_BASED_OUTPATIENT_CLINIC_OR_DEPARTMENT_OTHER): Payer: Self-pay | Admitting: Certified Registered"

## 2016-05-25 ENCOUNTER — Ambulatory Visit (HOSPITAL_BASED_OUTPATIENT_CLINIC_OR_DEPARTMENT_OTHER): Payer: Commercial Managed Care - PPO | Admitting: Certified Registered"

## 2016-05-25 ENCOUNTER — Ambulatory Visit (HOSPITAL_BASED_OUTPATIENT_CLINIC_OR_DEPARTMENT_OTHER)
Admission: RE | Admit: 2016-05-25 | Discharge: 2016-05-25 | Disposition: A | Discharge status: Home / Self Care | Payer: Commercial Managed Care - PPO | Source: Ambulatory Visit | Attending: Plastic Surgery | Admitting: Plastic Surgery

## 2016-05-25 ENCOUNTER — Encounter (HOSPITAL_BASED_OUTPATIENT_CLINIC_OR_DEPARTMENT_OTHER)
Admission: RE | Disposition: A | Discharge status: Home / Self Care | Payer: Self-pay | Source: Ambulatory Visit | Attending: Plastic Surgery

## 2016-05-25 DIAGNOSIS — I96 Gangrene, not elsewhere classified: Secondary | ICD-10-CM | POA: Insufficient documentation

## 2016-05-25 DIAGNOSIS — L7682 Other postprocedural complications of skin and subcutaneous tissue: Secondary | ICD-10-CM | POA: Insufficient documentation

## 2016-05-25 DIAGNOSIS — Z9011 Acquired absence of right breast and nipple: Secondary | ICD-10-CM | POA: Insufficient documentation

## 2016-05-25 DIAGNOSIS — Y838 Other surgical procedures as the cause of abnormal reaction of the patient, or of later complication, without mention of misadventure at the time of the procedure: Secondary | ICD-10-CM | POA: Diagnosis not present

## 2016-05-25 DIAGNOSIS — Z853 Personal history of malignant neoplasm of breast: Secondary | ICD-10-CM | POA: Insufficient documentation

## 2016-05-25 HISTORY — PX: INCISION AND DRAINAGE OF WOUND: SHX1803

## 2016-05-25 HISTORY — PX: REMOVAL OF TISSUE EXPANDER: SHX6324

## 2016-05-25 SURGERY — IRRIGATION AND DEBRIDEMENT WOUND
Anesthesia: General | Site: Breast | Laterality: Right

## 2016-05-25 MED ORDER — LACTATED RINGERS IV SOLN
INTRAVENOUS | Status: DC
  Administered 2016-05-25 (×2): via INTRAVENOUS
  Administered 2016-05-25: 10 mL/h via INTRAVENOUS

## 2016-05-25 MED ORDER — DEXAMETHASONE SODIUM PHOSPHATE 4 MG/ML IJ SOLN
INTRAMUSCULAR | Status: DC | PRN
  Administered 2016-05-25: 10 mg via INTRAVENOUS

## 2016-05-25 MED ORDER — MIDAZOLAM HCL 2 MG/2ML IJ SOLN
INTRAMUSCULAR | Status: AC
  Filled 2016-05-25: qty 2

## 2016-05-25 MED ORDER — HYDROMORPHONE HCL 1 MG/ML IJ SOLN
INTRAMUSCULAR | Status: AC
  Filled 2016-05-25: qty 1

## 2016-05-25 MED ORDER — SCOPOLAMINE 1 MG/3DAYS TD PT72: 1.0000 | MEDICATED_PATCH | Freq: Once | TRANSDERMAL | Status: DC | PRN

## 2016-05-25 MED ORDER — PROPOFOL 10 MG/ML IV BOLUS
INTRAVENOUS | Status: DC | PRN
  Administered 2016-05-25: 200 mg via INTRAVENOUS
  Administered 2016-05-25: 20 mg via INTRAVENOUS

## 2016-05-25 MED ORDER — CEFAZOLIN SODIUM-DEXTROSE 2-4 GM/100ML-% IV SOLN
INTRAVENOUS | Status: AC
  Filled 2016-05-25: qty 100

## 2016-05-25 MED ORDER — GLYCOPYRROLATE 0.2 MG/ML IJ SOLN: 0.2000 mg | Freq: Once | INTRAMUSCULAR | Status: DC | PRN

## 2016-05-25 MED ORDER — FENTANYL CITRATE (PF) 100 MCG/2ML IJ SOLN
INTRAMUSCULAR | Status: AC
  Filled 2016-05-25: qty 2

## 2016-05-25 MED ORDER — SODIUM CHLORIDE 0.9 % IV SOLN
INTRAVENOUS | Status: DC | PRN
  Administered 2016-05-25: 200 mL

## 2016-05-25 MED ORDER — ONDANSETRON HCL 4 MG/2ML IJ SOLN
INTRAMUSCULAR | Status: DC | PRN
  Administered 2016-05-25: 4 mg via INTRAVENOUS

## 2016-05-25 MED ORDER — LIDOCAINE 2% (20 MG/ML) 5 ML SYRINGE
INTRAMUSCULAR | Status: AC
  Filled 2016-05-25: qty 5

## 2016-05-25 MED ORDER — CEFAZOLIN SODIUM-DEXTROSE 2-4 GM/100ML-% IV SOLN
2.0000 g | INTRAVENOUS | Status: AC
  Administered 2016-05-25: 2 g via INTRAVENOUS

## 2016-05-25 MED ORDER — CHLORHEXIDINE GLUCONATE CLOTH 2 % EX PADS: 6.0000 | MEDICATED_PAD | Freq: Once | CUTANEOUS | Status: DC

## 2016-05-25 MED ORDER — FENTANYL CITRATE (PF) 100 MCG/2ML IJ SOLN
50.0000 ug | INTRAMUSCULAR | Status: AC | PRN
  Administered 2016-05-25: 25 ug via INTRAVENOUS
  Administered 2016-05-25: 100 ug via INTRAVENOUS
  Administered 2016-05-25: 25 ug via INTRAVENOUS

## 2016-05-25 MED ORDER — ONDANSETRON HCL 4 MG/2ML IJ SOLN
INTRAMUSCULAR | Status: AC
  Filled 2016-05-25: qty 2

## 2016-05-25 MED ORDER — HYDROMORPHONE HCL 1 MG/ML IJ SOLN
0.2500 mg | INTRAMUSCULAR | Status: DC | PRN
  Administered 2016-05-25 (×3): 0.5 mg via INTRAVENOUS

## 2016-05-25 MED ORDER — HYDROCODONE-ACETAMINOPHEN 5-325 MG PO TABS
2.0000 | ORAL_TABLET | Freq: Once | ORAL | Status: AC
  Administered 2016-05-25: 2 via ORAL

## 2016-05-25 MED ORDER — 0.9 % SODIUM CHLORIDE (POUR BTL) OPTIME
TOPICAL | Status: DC | PRN
  Administered 2016-05-25: 300 mL

## 2016-05-25 MED ORDER — DEXAMETHASONE SODIUM PHOSPHATE 10 MG/ML IJ SOLN
INTRAMUSCULAR | Status: AC
  Filled 2016-05-25: qty 1

## 2016-05-25 MED ORDER — KETOROLAC TROMETHAMINE 30 MG/ML IJ SOLN
30.0000 mg | Freq: Once | INTRAMUSCULAR | Status: AC
  Administered 2016-05-25: 30 mg via INTRAVENOUS

## 2016-05-25 MED ORDER — LACTATED RINGERS IV SOLN: INTRAVENOUS | Status: DC

## 2016-05-25 MED ORDER — KETOROLAC TROMETHAMINE 30 MG/ML IJ SOLN
INTRAMUSCULAR | Status: AC
  Filled 2016-05-25: qty 1

## 2016-05-25 MED ORDER — LIDOCAINE 2% (20 MG/ML) 5 ML SYRINGE
INTRAMUSCULAR | Status: DC | PRN
  Administered 2016-05-25: 100 mg via INTRAVENOUS

## 2016-05-25 MED ORDER — MEPERIDINE HCL 25 MG/ML IJ SOLN: 6.2500 mg | INTRAMUSCULAR | Status: DC | PRN

## 2016-05-25 MED ORDER — HYDROCODONE-ACETAMINOPHEN 5-325 MG PO TABS
ORAL_TABLET | ORAL | Status: AC
  Filled 2016-05-25: qty 2

## 2016-05-25 MED ORDER — PROMETHAZINE HCL 25 MG/ML IJ SOLN: 6.2500 mg | INTRAMUSCULAR | Status: DC | PRN

## 2016-05-25 MED ORDER — MIDAZOLAM HCL 2 MG/2ML IJ SOLN
1.0000 mg | INTRAMUSCULAR | Status: DC | PRN
  Administered 2016-05-25: 2 mg via INTRAVENOUS

## 2016-05-25 SURGICAL SUPPLY — 71 items
BAG DECANTER FOR FLEXI CONT (MISCELLANEOUS) ×3 IMPLANT
BANDAGE ACE 6X5 VEL STRL LF (GAUZE/BANDAGES/DRESSINGS) IMPLANT
BINDER BREAST LRG (GAUZE/BANDAGES/DRESSINGS) ×3 IMPLANT
BINDER BREAST MEDIUM (GAUZE/BANDAGES/DRESSINGS) IMPLANT
BINDER BREAST XLRG (GAUZE/BANDAGES/DRESSINGS) IMPLANT
BINDER BREAST XXLRG (GAUZE/BANDAGES/DRESSINGS) IMPLANT
BLADE HEX COATED 2.75 (ELECTRODE) ×3 IMPLANT
BLADE SURG 10 STRL SS (BLADE) ×3 IMPLANT
BLADE SURG 15 STRL LF DISP TIS (BLADE) IMPLANT
BLADE SURG 15 STRL SS (BLADE)
BNDG GAUZE ELAST 4 BULKY (GAUZE/BANDAGES/DRESSINGS) IMPLANT
CANISTER SUCT 1200ML W/VALVE (MISCELLANEOUS) ×3 IMPLANT
CHLORAPREP W/TINT 26ML (MISCELLANEOUS) ×3 IMPLANT
COVER BACK TABLE 60X90IN (DRAPES) ×3 IMPLANT
COVER MAYO STAND STRL (DRAPES) ×3 IMPLANT
DECANTER SPIKE VIAL GLASS SM (MISCELLANEOUS) IMPLANT
DRAIN CHANNEL 15F RND FF W/TCR (WOUND CARE) ×3 IMPLANT
DRAPE LAPAROSCOPIC ABDOMINAL (DRAPES) ×3 IMPLANT
DRAPE U-SHAPE 76X120 STRL (DRAPES) IMPLANT
DRSG PAD ABDOMINAL 8X10 ST (GAUZE/BANDAGES/DRESSINGS) ×6 IMPLANT
DRSG TEGADERM 2-3/8X2-3/4 SM (GAUZE/BANDAGES/DRESSINGS) ×6 IMPLANT
DRSG TELFA 3X8 NADH (GAUZE/BANDAGES/DRESSINGS) ×3 IMPLANT
ELECT BLADE 4.0 EZ CLEAN MEGAD (MISCELLANEOUS) ×3
ELECT COATED BLADE 2.86 ST (ELECTRODE) ×3 IMPLANT
ELECT REM PT RETURN 9FT ADLT (ELECTROSURGICAL) ×3
ELECTRODE BLDE 4.0 EZ CLN MEGD (MISCELLANEOUS) ×2 IMPLANT
ELECTRODE REM PT RTRN 9FT ADLT (ELECTROSURGICAL) ×2 IMPLANT
EVACUATOR SILICONE 100CC (DRAIN) ×3 IMPLANT
GAUZE SPONGE 4X4 12PLY STRL (GAUZE/BANDAGES/DRESSINGS) IMPLANT
GLOVE BIO SURGEON STRL SZ 6 (GLOVE) ×9 IMPLANT
GLOVE BIO SURGEON STRL SZ 6.5 (GLOVE) IMPLANT
GLOVE BIOGEL PI IND STRL 7.0 (GLOVE) ×4 IMPLANT
GLOVE BIOGEL PI INDICATOR 7.0 (GLOVE) ×2
GLOVE ECLIPSE 6.5 STRL STRAW (GLOVE) ×3 IMPLANT
GOWN STRL REUS W/ TWL LRG LVL3 (GOWN DISPOSABLE) ×4 IMPLANT
GOWN STRL REUS W/TWL LRG LVL3 (GOWN DISPOSABLE) ×2
IV NS 500ML (IV SOLUTION)
IV NS 500ML BAXH (IV SOLUTION) IMPLANT
KIT FILL SYSTEM UNIVERSAL (SET/KITS/TRAYS/PACK) IMPLANT
LIQUID BAND (GAUZE/BANDAGES/DRESSINGS) ×3 IMPLANT
NEEDLE HYPO 25X1 1.5 SAFETY (NEEDLE) IMPLANT
NS IRRIG 1000ML POUR BTL (IV SOLUTION) ×3 IMPLANT
PACK BASIN DAY SURGERY FS (CUSTOM PROCEDURE TRAY) ×3 IMPLANT
PACK UNIVERSAL I (CUSTOM PROCEDURE TRAY) IMPLANT
PENCIL BUTTON HOLSTER BLD 10FT (ELECTRODE) ×3 IMPLANT
PIN SAFETY STERILE (MISCELLANEOUS) ×3 IMPLANT
SLEEVE SCD COMPRESS KNEE MED (MISCELLANEOUS) ×3 IMPLANT
SPONGE GAUZE 4X4 12PLY STER LF (GAUZE/BANDAGES/DRESSINGS) IMPLANT
SPONGE LAP 18X18 X RAY DECT (DISPOSABLE) ×6 IMPLANT
STAPLER VISISTAT 35W (STAPLE) ×3 IMPLANT
SUT ETHILON 2 0 FS 18 (SUTURE) ×3 IMPLANT
SUT ETHILON 4 0 PS 2 18 (SUTURE) ×6 IMPLANT
SUT MNCRL AB 4-0 PS2 18 (SUTURE) IMPLANT
SUT PDS 3-0 CT2 (SUTURE)
SUT PDS AB 2-0 CT2 27 (SUTURE) ×3 IMPLANT
SUT PDS II 3-0 CT2 27 ABS (SUTURE) IMPLANT
SUT VIC AB 3-0 FS2 27 (SUTURE) ×6 IMPLANT
SUT VIC AB 3-0 PS1 18 (SUTURE)
SUT VIC AB 3-0 PS1 18XBRD (SUTURE) IMPLANT
SUT VIC AB 3-0 SH 27 (SUTURE)
SUT VIC AB 3-0 SH 27X BRD (SUTURE) IMPLANT
SUT VICRYL 4-0 PS2 18IN ABS (SUTURE) ×6 IMPLANT
SWAB COLLECTION DEVICE MRSA (MISCELLANEOUS) IMPLANT
SWAB CULTURE ESWAB REG 1ML (MISCELLANEOUS) IMPLANT
SYR 50ML LL SCALE MARK (SYRINGE) IMPLANT
SYR BULB IRRIGATION 50ML (SYRINGE) ×3 IMPLANT
SYR CONTROL 10ML LL (SYRINGE) IMPLANT
TOWEL OR 17X24 6PK STRL BLUE (TOWEL DISPOSABLE) ×6 IMPLANT
TUBE CONNECTING 20X1/4 (TUBING) ×3 IMPLANT
UNDERPAD 30X30 (UNDERPADS AND DIAPERS) ×3 IMPLANT
YANKAUER SUCT BULB TIP NO VENT (SUCTIONS) ×3 IMPLANT

## 2016-05-25 NOTE — Discharge Instructions (Signed)

## 2016-05-25 NOTE — Anesthesia Preprocedure Evaluation (Addendum)
Anesthesia Evaluation  Patient identified by MRN, date of birth, ID band Patient awake    Reviewed: Allergy & Precautions, NPO status , Patient's Chart, lab work & pertinent test results  Airway Mallampati: I       Dental  (+) Teeth Intact, Dental Advisory Given   Pulmonary neg pulmonary ROS, former smoker,    breath sounds clear to auscultation       Cardiovascular negative cardio ROS   Rhythm:Regular Rate:Normal     Neuro/Psych PSYCHIATRIC DISORDERS Anxiety Depression negative neurological ROS     GI/Hepatic negative GI ROS, Neg liver ROS,   Endo/Other  Hypothyroidism   Renal/GU negative Renal ROS  negative genitourinary   Musculoskeletal negative musculoskeletal ROS (+)   Abdominal   Peds negative pediatric ROS (+)  Hematology negative hematology ROS (+)   Anesthesia Other Findings   Reproductive/Obstetrics negative OB ROS                            Lab Results  Component Value Date   WBC 6.2 05/08/2016   HGB 13.0 05/08/2016   HCT 38.9 05/08/2016   MCV 88.6 05/08/2016   PLT 267 05/08/2016   Lab Results  Component Value Date   CREATININE 0.79 05/08/2016   BUN 10 05/08/2016   NA 136 05/08/2016   K 3.7 05/08/2016   CL 103 05/08/2016   CO2 26 05/08/2016   No results found for: INR, PROTIME   Anesthesia Physical Anesthesia Plan  ASA: II  Anesthesia Plan: General   Post-op Pain Management:    Induction: Intravenous  Airway Management Planned: LMA  Additional Equipment:   Intra-op Plan:   Post-operative Plan: Extubation in OR  Informed Consent: I have reviewed the patients History and Physical, chart, labs and discussed the procedure including the risks, benefits and alternatives for the proposed anesthesia with the patient or authorized representative who has indicated his/her understanding and acceptance.   Dental advisory given  Plan Discussed with:  CRNA  Anesthesia Plan Comments:         Anesthesia Quick Evaluation

## 2016-05-25 NOTE — Interval H&P Note (Signed)
History and Physical Interval Note:  05/25/2016 8:45 AM  Katelyn Floyd  has presented today for surgery, with the diagnosis of RIGHT BREAST DCIS,ACQUIRED ABSENCE BREAST SKIN FLAP NECROSIS  The various methods of treatment have been discussed with the patient and family. After consideration of risks, benefits and other options for treatment, the patient has consented to  Debridement right mastectomy flap,possible tissue expander exchange, possible tissue expander removal as a surgical intervention .  The patient's history has been reviewed, patient examined, no change in status, stable for surgery.  I have reviewed the patient's chart and labs.  Questions were answered to the patient's satisfaction.     Alaa Mullally

## 2016-05-25 NOTE — Anesthesia Procedure Notes (Signed)
Procedure Name: LMA Insertion Date/Time: 05/25/2016 9:26 AM Performed by: Baxter Flattery Pre-anesthesia Checklist: Patient identified, Emergency Drugs available, Suction available and Patient being monitored Patient Re-evaluated:Patient Re-evaluated prior to inductionOxygen Delivery Method: Circle system utilized Preoxygenation: Pre-oxygenation with 100% oxygen Intubation Type: IV induction Ventilation: Mask ventilation without difficulty LMA: LMA inserted LMA Size: 3.0 Number of attempts: 1 Airway Equipment and Method: Bite block Placement Confirmation: positive ETCO2 and breath sounds checked- equal and bilateral Tube secured with: Tape Dental Injury: Teeth and Oropharynx as per pre-operative assessment

## 2016-05-25 NOTE — Op Note (Signed)
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 Skin flap necrosis  POSTOPERATIVE DIAGNOSES:  same  PROCEDURE:  1. Excisional debridement skin subcutaneous tissue right breast 7 x 15 cm. 2. Removal right tissue expander 3. Layered closure 17 cm right chest  SURGEON: Irene Limbo MD MBA  ASSISTANT: none  ANESTHESIA:  General.   EBL: 25 ml  COMPLICATIONS: None immediate.   INDICATIONS FOR PROCEDURE:  The patient, Katelyn Floyd, is a 50 y.o. female born on Jan 04, 1966, is here for debridement right mastectomy flap following nipple sparing mastectomy with immediate expander reconstruction.   FINDINGS: Full thickness necrosis mastectomy flap over lower pole breast and nipple. Nipple areolar complex excised with debdridement.  DESCRIPTION OF PROCEDURE:  The patient's operative site was marked with the patient in the preoperative area. The patient was taken to the operating room. SCDs were placed and IV antibiotics were given. The patient's operative site was prepped and draped in a sterile fashion. A time out was performed and all information was confirmed to be correct. Patient demonstrated eschar dry superior to IMF incision and over nipple with intervening ischemic changes. Incision made through prior IMF. Areas of dry eschar excised full thickness with knife. Additional debridement performed with scissors until bleeding dermal tissue encountered. Thrombosis of vessels noted within subcutaneous fat and this was excised with scissors. Total area resected 7 x 15 cm. Given the amount of flap necrosis, elected to remove tissue expander. The pectoralis and serratus muscles were sutured to chest wall with interrupted 2-0 PDS in figure of eight fashion. Cavity irrigated with solution containing Ancef, gentamicin, and bacitracin. New 15 Fr JP  drain placed and secured with 2-0 nylon. Closure completed in layers and final scar T shaped. Superficial fascia approximated with interrupted 3-0 vicryl followed by 4-0 vicryl in dermis. Skin closure completed with short running 4-0 nylon. Length closure 17 cm. Dry dressing, breast binder applied.  The patient was allowed to wake from anesthesia, extubated and taken to the recovery room in satisfactory condition.   SPECIMENS: right mastectomy flap  DRAINS: 15 Fr JP right subcutaneous chest  Irene Limbo, MD [facility] Plastic & Reconstructive Surgery [phone], pin [phone]

## 2016-05-25 NOTE — Transfer of Care (Signed)
Immediate Anesthesia Transfer of Care Note  Patient: Katelyn Floyd  Procedure(s) Performed: Procedure(s): DEBRIDEMENT RIGHT MASTECTOMY FLAP (Right) REMOVAL OF TISSUE EXPANDER (Right)  Patient Location: PACU  Anesthesia Type:General  Level of Consciousness: awake, alert  and patient cooperative  Airway & Oxygen Therapy: Patient Spontanous Breathing and Patient connected to face mask oxygen  Post-op Assessment: Report given to RN, Post -op Vital signs reviewed and stable and Patient moving all extremities  Post vital signs: Reviewed and stable  Last Vitals:  Vitals:   05/25/16 0830  BP: (!) 103/44  Pulse: 71  Resp: 18  Temp: 36.7 C    Last Pain:  Vitals:   05/25/16 0830  TempSrc: Oral  PainSc: 0-No pain      Patients Stated Pain Goal: 0 ([id number])  Complications: No apparent anesthesia complications

## 2016-05-28 ENCOUNTER — Encounter (HOSPITAL_BASED_OUTPATIENT_CLINIC_OR_DEPARTMENT_OTHER): Payer: Self-pay | Admitting: Plastic Surgery

## 2016-06-04 ENCOUNTER — Ambulatory Visit: Payer: Commercial Managed Care - PPO | Admitting: Hematology

## 2016-06-04 NOTE — Anesthesia Postprocedure Evaluation (Signed)
Anesthesia Post Note  Patient: GEORGANA STROM  Procedure(s) Performed: Procedure(s) (LRB): DEBRIDEMENT RIGHT MASTECTOMY FLAP (Right) REMOVAL OF TISSUE EXPANDER (Right)  Patient location during evaluation: PACU Anesthesia Type: General Level of consciousness: awake and alert Pain management: pain level controlled Vital Signs Assessment: post-procedure vital signs reviewed and stable Respiratory status: spontaneous breathing, nonlabored ventilation, respiratory function stable and patient connected to nasal cannula oxygen Cardiovascular status: blood pressure returned to baseline and stable Postop Assessment: no signs of nausea or vomiting Anesthetic complications: no    Last Vitals:  Vitals:   05/25/16 1312 05/25/16 1326  BP:    Pulse: 93 90  Resp: 11 12  Temp:  36.9 C    Last Pain:  Vitals:   05/25/16 1326  TempSrc: Oral  PainSc: 3                  Effie Berkshire

## 2016-06-06 ENCOUNTER — Ambulatory Visit: Payer: Commercial Managed Care - PPO | Attending: Plastic Surgery | Admitting: Physical Therapy

## 2016-06-06 DIAGNOSIS — M79621 Pain in right upper arm: Secondary | ICD-10-CM | POA: Diagnosis present

## 2016-06-06 DIAGNOSIS — M25611 Stiffness of right shoulder, not elsewhere classified: Secondary | ICD-10-CM | POA: Diagnosis not present

## 2016-06-06 DIAGNOSIS — R222 Localized swelling, mass and lump, trunk: Secondary | ICD-10-CM | POA: Diagnosis present

## 2016-06-06 NOTE — Therapy (Signed)
[place], [place], [address] Phone: [phone]   Fax:  [phone]  Physical Therapy Evaluation  Patient Details  Name: Katelyn Floyd MRN: [id number] Date of Birth: 22-Nov-1965 Referring Provider: Dr. Iran Planas   Encounter Date: 06/06/2016      PT End of Session - 06/06/16 1237    Visit Number 1   Number of Visits 9   Date for PT Re-Evaluation 07/13/16   PT Start Time [id number]   PT Stop Time 1100   PT Time Calculation (min) 45 min   Activity Tolerance Patient tolerated treatment well   Behavior During Therapy [facility] for tasks assessed/performed      Past Medical History:  Diagnosis Date  . Anxiety   . Depression   . Hypothyroidism   . Thyroid disease     Past Surgical History:  Procedure Laterality Date  . BREAST RECONSTRUCTION WITH PLACEMENT OF TISSUE EXPANDER AND FLEX HD (ACELLULAR HYDRATED DERMIS) Right 05/14/2016   Procedure: RIGHT BREAST RECONSTRUCTION WITH PLACEMENT OF TISSUE EXPANDER AND POSSIBLE ACELLULAR HYDRATED DERMIS;  Surgeon: Irene Limbo, MD;  Location: [place];  Service: Plastics;  Laterality: Right;  . INCISION AND DRAINAGE OF WOUND Right 05/25/2016   Procedure: DEBRIDEMENT RIGHT MASTECTOMY FLAP;  Surgeon: Irene Limbo, MD;  Location: [place];  Service: Plastics;  Laterality: Right;  . NIPPLE SPARING MASTECTOMY/SENTINAL LYMPH NODE BIOPSY/RECONSTRUCTION/PLACEMENT OF TISSUE EXPANDER Right 05/14/2016   Procedure: RIGHT NIPPLE SPARING MASTECTOMY WITH SENTINAL LYMPH NODE BIOPSY ;  Surgeon: Autumn Messing III, MD;  Location: [place];  Service: General;  Laterality: Right;  . REMOVAL OF TISSUE EXPANDER Right 05/25/2016   Procedure: REMOVAL OF TISSUE EXPANDER;  Surgeon: Irene Limbo, MD;  Location: [place];  Service: Plastics;  Laterality: Right;  . TONSILECTOMY, ADENOIDECTOMY, BILATERAL MYRINGOTOMY AND TUBES    . TONSILLECTOMY     Adenoids, x2     There were no vitals filed for this visit.       Subjective Assessment - 06/06/16 0946    Subjective diffuclty raising her arm and feeling fullness under her arm    Pertinent History pt has nipple sparing mastectomy with 2 lymph nodes removed july 17 with immediate expander placement.  She developed tissue necrosis and had expander and all tissue removed on july 28.  She has not had chemo and will not have to have radiation    Patient Stated Goals better her range of motion and get rid of pain    Currently in Pain? No/denies  [place]             [facility] PT Assessment - 06/06/16 0001      Assessment   Medical Diagnosis breast cancer    Referring Provider Dr. Iran Planas    Onset Date/Surgical Date 05/14/16   Hand Dominance Right     Precautions   Precautions Other (comment)   Precaution Comments previous      Restrictions   Weight Bearing Restrictions No     Balance Screen   Has the patient fallen in the past 6 months No   Has the patient had a decrease in activity level because of a fear of falling?  No   Is the patient reluctant to leave their home because of a fear of falling?  No     Home Environment   Living Environment Private residence   Living Arrangements Spouse/significant other   Available Help at Discharge Available PRN/intermittently     Prior  Function   Level of Independence Independent   Vocation Full time employment   Engineer, manufacturing job, works for her church    Leisure not a Chief Operating Officer, likes to read and see movies   talked with patient about Medical laboratory scientific officer program at [employer]   Overall Cognitive Status Within Functional Limits for tasks assessed     Observation/Other Assessments   Observations edema at left anterior chest lateral chest that is tender and feels warm but no redness    Skin Integrity healing incision on right anterior chest with scabbed area      Coordination   Gross Motor Movements are Fluid  and Coordinated Yes     Posture/Postural Control   Posture/Postural Control Postural limitations   Postural Limitations Forward head;Decreased thoracic kyphosis   Posture Comments pt has assymmetrical scapular movment with decreased scapular stabilty      ROM / Strength   AROM / PROM / Strength AROM;Strength     AROM   Right Shoulder Flexion 130 Degrees   Right Shoulder ABduction 113 Degrees   Right Shoulder Internal Rotation 75 Degrees   Right Shoulder External Rotation 84 Degrees   Left Shoulder Flexion 160 Degrees   Left Shoulder ABduction 170 Degrees   Left Shoulder Internal Rotation 75 Degrees   Left Shoulder External Rotation 85 Degrees     Strength   Overall Strength Deficits   Overall Strength Comments right shoulder muscles about 3-/5 and limited by pain      Palpation   Palpation comment tenderness and tightness in right axilla and pec major muscle            LYMPHEDEMA/ONCOLOGY QUESTIONNAIRE - 06/06/16 1013      Right Upper Extremity Lymphedema   10 cm Proximal to Olecranon Process 27 cm   15 cm Proximal to Ulnar Styloid Process 24 cm   Just Proximal to Ulnar Styloid Process 15 cm   Across Hand at [employer] 18 cm   At [place] of 2nd Digit 6.2 cm   Other visible fullness at anterior and lateral chest that is tender      Left Upper Extremity Lymphedema   15 cm Proximal to Olecranon Process 27 cm   15 cm Proximal to Ulnar Styloid Process 23.5 cm   Just Proximal to Ulnar Styloid Process 14.5 cm   Across Hand at [employer] 18 cm   At [place] of 2nd Digit 6 cm                [facility] Adult PT Treatment/Exercise - 06/06/16 0001      Self-Care   Self-Care Other Self-Care Comments   Other Self-Care Comments  piece of thin foam placed inside bra at lateral chest.  Gave pt information for lighter weight Knitted Knockers that she can get online.  place ABD pad over incision to protect it from being rubbed by prosthesis      Shoulder Exercises: Supine    Other Supine Exercises dowel rod flexion  x 5 reps                 PT Education - 06/06/16 1222    Education provided Yes   Education Details dowel rod flexion in supine    Person(s) Educated Patient   Methods Explanation;Demonstration   Comprehension Verbalized understanding;Returned demonstration                [facility] Goals - 06/06/16 1255  CC Long Term Goal  #1   Title Patient with verbalize an understanding of lymphedema risk reduction precautions   Time 4   Period Weeks   Status New     CC Long Term Goal  #2   Title Patient will improve left shoulder abduction to 140 degrees so that she can perform her daily activities of  overhead reaching: homemaking and tasks of cooking and cleaning   Baseline 113 on 06/06/2016   Time 4   Period Weeks   Status New     CC Long Term Goal  #3   Title Patient will improve shoulder flexion range of motion to 140 degrees to perform activities of daily living and household chores with greater ease   Baseline 130 on 06/06/2016   Time 4   Period Weeks   Status New     CC Long Term Goal  #5   Title Patient will report a decrease in pain by 50% so they can perform daily activities with greater ease   Time 4   Period Weeks   Status New            Plan - 06/06/16 1238    Clinical Impression Statement 50 yo female 2 weeks after surgery for removal of right  expander after mastectomy with 2 nodes removed on July 17. She is still healing with scab over anterior chest incision and edema above incision and at lateral chest .  She has returned to work part time and will go back to full time next week.     Rehab Potential Good   Clinical Impairments Affecting Rehab Potential 2 surgeries in the past month.    PT Frequency 2x / week   PT Duration 4 weeks   PT Treatment/Interventions Therapeutic activities;Therapeutic exercise;Taping;Manual techniques;DME Instruction;Neuromuscular re-education;Manual lymph  drainage;Functional mobility training;Patient/family education;Passive range of motion;Scar mobilization;ADLs/Self Care Home Management   PT Next Visit Plan assess benefit of foam to lateral chest, manual lymph draiange to anterior and lateral chest.  A/ AA/PROM to right shoulder  Give information about ABC class    Recommended Other Services consider ABC class    Consulted and Agree with Plan of Care Patient      Patient will benefit from skilled therapeutic intervention in order to improve the following deficits and impairments:  Decreased range of motion, Increased fascial restricitons, Impaired UE functional use, Decreased knowledge of precautions, Decreased skin integrity, Pain, Decreased scar mobility, Increased edema, Decreased strength, Postural dysfunction  Visit Diagnosis: Stiffness of right shoulder, not elsewhere classified - Plan: PT plan of care cert/re-cert  Pain in right upper arm - Plan: PT plan of care cert/re-cert  Localized swelling, mass and lump, trunk - Plan: PT plan of care cert/re-cert     Problem List Patient Active Problem List   Diagnosis Date Noted  . Ductal carcinoma in situ (DCIS) of right breast 05/14/2016  . Breast cancer of lower-outer quadrant of right female breast ([place]) 03/09/2016   Donato Heinz. Owens Shark PT   Norwood Levo 06/06/2016, 1:07 PM  [place], [place], [address] Phone: [phone]   Fax:  [phone]  Name: Katelyn Floyd MRN: [id number] Date of Birth: 1966/05/25

## 2016-06-06 NOTE — Patient Instructions (Signed)
Cane Overhead - Supine  Hold cane at thighs with both hands, extend arms straight over head. Hold 2___ seconds. Repeat _5-10__ times. Do __3_ times per day.

## 2016-06-11 ENCOUNTER — Ambulatory Visit (HOSPITAL_BASED_OUTPATIENT_CLINIC_OR_DEPARTMENT_OTHER): Payer: Commercial Managed Care - PPO | Admitting: Hematology

## 2016-06-11 ENCOUNTER — Telehealth: Payer: Self-pay | Admitting: Hematology

## 2016-06-11 ENCOUNTER — Encounter: Payer: Self-pay | Admitting: Hematology

## 2016-06-11 VITALS — BP 107/42 | HR 84 | Temp 98.7°F | Resp 18 | Ht 60.0 in | Wt 158.9 lb

## 2016-06-11 DIAGNOSIS — F419 Anxiety disorder, unspecified: Secondary | ICD-10-CM

## 2016-06-11 DIAGNOSIS — D0511 Intraductal carcinoma in situ of right breast: Secondary | ICD-10-CM | POA: Diagnosis not present

## 2016-06-11 DIAGNOSIS — C50511 Malignant neoplasm of lower-outer quadrant of right female breast: Secondary | ICD-10-CM

## 2016-06-11 MED ORDER — TAMOXIFEN CITRATE 20 MG PO TABS: 20.0000 mg | ORAL_TABLET | ORAL | 3 refills | Status: DC

## 2016-06-11 NOTE — Telephone Encounter (Signed)
Gave patient avs report and appointments for November.  °

## 2016-06-11 NOTE — Progress Notes (Signed)
[place]  [phone] [phone]  Clinic Follow up Note   Patient Care Team: Lona Kettle, MD as PCP - General (Family Medicine) Autumn Messing III, MD as Consulting Physician (General Surgery) 06/11/2016   CHIEF COMPLAINTS:  Follow up right breast DCIS    Oncology History   Breast cancer of lower-outer quadrant of right female breast [facility])   Staging form: Breast, AJCC 7th Edition     Clinical stage from 02/24/2016: Stage 0 (Tis (DCIS), N0, cM0(i+)) - Signed by Truitt Merle, MD on 03/09/2016       Breast cancer of lower-outer quadrant of right female breast ([place])   02/20/2016 Mammogram    Diagnostic mammogram showed a 7 mm slightly suspicious group of calcifications within the OLQ of R breast, with a second smaller more faint group of calcification spanning 4 mm located 1.5 cm lateral.      02/24/2016 Initial Diagnosis    Breast cancer of lower-outer quadrant of right female breast ([place])      02/24/2016 Receptors her2    ER 100%+, PR 75%+      02/24/2016 Initial Biopsy    Right breast core needle biopsy showed ductal carcinoma in situ with calcification, intermediate to high-grade.      05/14/2016 Surgery    Right breast mastectomy and sentinel lymph node biopsy      05/14/2016 Pathologic Stage    Right breast mastectomy showed DCIS with calcification, intermediate grade, 1.0 cm, surgical margins were negative. 3 sentinel lymph nodes were negative.       HISTORY OF PRESENTING ILLNESS:  Katelyn Floyd 50 y.o. female is here because of her newly diagnosed right breast DCIS. She is accompanied by her husband to the clinic today.  This was discovered by screening mammogram. She has been very compliant with screening mammogram, no prior abnormal mammogram findings or history of breast biopsy. She denies any palpable breast mass, skin change or nipple discharge. Diagnostic mammogram and ultrasound on 02/20/2016 showed a 7 mm solid a suspicious  group of calcification within the outer lower quadrant of right breast, with a second smaller group of calcification 4 mm located 1.5 cm lateral. She underwent core needle biopsy on 02/24/2016, which showed DCIS, intermediate to high-grade. ER and PR positive.  She is married, with 2 children. No family history of breast cancer. She is physically active, feels well well, denies any pain or other symptoms. No recent weight loss.  GYN HISTORY  Menarchal: 15 LMP: 05/2015  Contraceptive: no  HRT: she took for a few months  G4P2: she had micarriage and aborsion, 23 boy and 19 yo daughter   CURRENT THERAPY: will restart Tamoxifen 87m daily started in  02/2016  INTERIM HISTORY: [name] for follow-up. She has been tolerating tamoxifen well, no significant hot flash or other side effects. She is doing well overall, still has some discomfort in her right on, for which she takes ibuprofen as needed. She otherwise doing well, no other complains.    MEDICAL HISTORY:  Past Medical History:  Diagnosis Date  . Anxiety   . Depression   . Hypothyroidism   . Thyroid disease     SURGICAL HISTORY: Past Surgical History:  Procedure Laterality Date  . BREAST RECONSTRUCTION WITH PLACEMENT OF TISSUE EXPANDER AND FLEX HD (ACELLULAR HYDRATED DERMIS) Right 05/14/2016   Procedure: RIGHT BREAST RECONSTRUCTION WITH PLACEMENT OF TISSUE EXPANDER AND POSSIBLE ACELLULAR HYDRATED DERMIS;  Surgeon: BIrene Limbo MD;  [place]  Service: Plastics;  Laterality: Right;  . INCISION AND DRAINAGE OF WOUND Right 05/25/2016   Procedure: DEBRIDEMENT RIGHT MASTECTOMY FLAP;  Surgeon: Irene Limbo, MD;  Location: [place];  Service: Plastics;  Laterality: Right;  . NIPPLE SPARING MASTECTOMY/SENTINAL LYMPH NODE BIOPSY/RECONSTRUCTION/PLACEMENT OF TISSUE EXPANDER Right 05/14/2016   Procedure: RIGHT NIPPLE SPARING MASTECTOMY WITH SENTINAL LYMPH NODE BIOPSY ;  Surgeon: Autumn Messing III, MD;  Location: [place];  Service: General;  Laterality: Right;  . REMOVAL OF TISSUE EXPANDER Right 05/25/2016   Procedure: REMOVAL OF TISSUE EXPANDER;  Surgeon: Irene Limbo, MD;  Location: [place];  Service: Plastics;  Laterality: Right;  . TONSILECTOMY, ADENOIDECTOMY, BILATERAL MYRINGOTOMY AND TUBES    . TONSILLECTOMY     Adenoids, x2    SOCIAL HISTORY: Social History   Social History  . Marital status: Married    Spouse name: N/A  . Number of children: N/A  . Years of education: N/A   Occupational History  . Not on file.   Social History Main Topics  . Smoking status: Former Smoker    Packs/day: 1.00    Years: 25.00    Types: Cigarettes    Quit date: 09/28/1998  . Smokeless tobacco: Never Used  . Alcohol use No  . Drug use: No  . Sexual activity: Yes     Comment: husband had vasectomy   Other Topics Concern  . Not on file   Social History Narrative  . 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 Lung cancer Mother 15  . Melanoma Brother     ALLERGIES:  is allergic to no known allergies.  MEDICATIONS:  Current Outpatient Prescriptions  Medication Sig Dispense Refill  . ALPRAZolam (XANAX) 0.25 MG tablet Take 0.125-0.25 mg by mouth 3 (three) times daily as needed for anxiety.     Francia Greaves THYROID 30 MG tablet Take 60-90 mg by mouth See admin instructions. 60 mg on Mon, Wed, and Fri.  90 mg all other days with breakfast  2  . aspirin EC 81 MG tablet Take 81 mg by mouth daily.    Marland Kitchen buPROPion (WELLBUTRIN XL) 300 MG 24 hr tablet Take 300 mg by mouth every morning.     . citalopram (CELEXA) 20 MG tablet Take 30 mg by mouth every morning.     . Multiple Vitamin (MULTIVITAMIN) capsule Take 1 capsule by mouth every morning.     . tamoxifen (NOLVADEX) 20 MG tablet Take 1 tablet (20 mg total) by mouth every morning. 30 tablet 3  . traZODone (DESYREL) 100 MG tablet Take 150 mg by mouth  at bedtime.     No current facility-administered medications for this visit.     REVIEW OF SYSTEMS:   Constitutional: Denies fevers, chills or abnormal night sweats Eyes: Denies blurriness of vision, double vision or watery eyes Ears, nose, mouth, throat, and face: Denies mucositis or sore throat Respiratory: Denies cough, dyspnea or wheezes Cardiovascular: Denies palpitation, chest discomfort or lower extremity swelling Gastrointestinal:  Denies nausea, heartburn or change in bowel habits Skin: Denies abnormal skin rashes Lymphatics: Denies new lymphadenopathy or easy bruising Neurological:Denies numbness, tingling or new weaknesses Behavioral/Psych: Mood is stable, no new changes  All other systems were reviewed with the patient and are negative.  PHYSICAL EXAMINATION: ECOG PERFORMANCE STATUS: 0 - Asymptomatic  Vitals:   06/11/16 1328  BP: (!) 107/42  Pulse:  84  Resp: 18  Temp: 98.7 F (37.1 C)   Filed Weights   06/11/16 1328  Weight: 158 lb 14.4 oz (72.1 kg)    GENERAL:alert, no distress and comfortable SKIN: skin color, texture, turgor are normal, no rashes or significant lesions EYES: normal, conjunctiva are pink and non-injected, sclera clear OROPHARYNX:no exudate, no erythema and lips, buccal mucosa, and tongue normal  NECK: supple, thyroid normal size, non-tender, without nodularity LYMPH:  no palpable lymphadenopathy in the cervical, axillary or inguinal LUNGS: clear to auscultation and percussion with normal breathing effort HEART: regular rate & rhythm and no murmurs and no lower extremity edema ABDOMEN:abdomen soft, non-tender and normal bowel sounds Musculoskeletal:no cyanosis of digits and no clubbing  PSYCH: alert & oriented x 3 with fluent speech NEURO: no focal motor/sensory deficits Breasts: Right breast surgically absent. Surgical incision has healed well, no discharge or open wound. Palpation of the left breast and axilla revealed no obvious mass that  I could appreciate.    LABORATORY DATA:  CBC Latest Ref Rng & Units 05/08/2016  WBC 4.0 - 10.5 K/uL 6.2  Hemoglobin 12.0 - 15.0 g/dL 13.0  Hematocrit 36.0 - 46.0 % 38.9  Platelets 150 - 400 K/uL 267   CMP Latest Ref Rng & Units 05/08/2016  Glucose 65 - 99 mg/dL 106(H)  BUN 6 - 20 mg/dL 10  Creatinine 0.44 - 1.00 mg/dL 0.79  Sodium 135 - 145 mmol/L 136  Potassium 3.5 - 5.1 mmol/L 3.7  Chloride 101 - 111 mmol/L 103  CO2 22 - 32 mmol/L 26  Calcium 8.9 - 10.3 mg/dL 9.0     PATHOLOGY REPORT: Diagnosis 02/24/2016 Breast, right, needle core biopsy, lower outer - DUCTAL CARCINOMA IN SITU WITH CALCIFICATIONS. - SEE COMMENT.  The caricnoma appears intermediate to high grade. Estrogen receptor and progesterone receptor studies will be performed and the results reported separately. The results were called to [facility] on 02/27/16. (JBK:gt, 02/27/16)  Results: IMMUNOHISTOCHEMICAL AND MORPHOMETRIC ANALYSIS PERFORMED MANUALLY Estrogen Receptor: 100%, POSITIVE, STRONG STAINING INTENSITY Progesterone Receptor: 75%, POSITIVE, MODERATE STAINING INTENSITY  Diagnosis 05/14/2016 1. Soft tissue, biopsy, Right retro nipple - BENIGN BREAST PARENCHYMA. - THERE IS NO EVIDENCE OF MALIGNANCY. 2. Breast, simple mastectomy, Right - DUCTAL CARCINOMA IN SITU WITH CALCIFICATIONS, INTERMEDIATE GRADE, SPANNING 1.0 CM. - THE SURGICAL RESECTION MARGINS ARE NEGATIVE FOR CARCINOMA. - SEE ONCOLOGY TABLE BELOW. 3. Lymph node, sentinel, biopsy, Right Axillary #1 - THERE IS NO EVIDENCE OF CARCINOMA IN 1 OF 1 LYMPH NODE (0/1). 4. Lymph node, sentinel, biopsy, Right Axillary #2 - THERE IS NO EVIDENCE OF CARCINOMA IN 1 OF 1 LYMPH NODE (0/1). 5. Fatty tissue, Right Axillary Contents - THERE IS NO EVIDENCE OF CARCINOMA IN 1 OF 1 LYMPH NODE (0/1). Microscopic Comment 2. BREAST, IN SITU CARCINOMA Specimen, including laterality: Right breast and right axillary lymph nodes Procedure (include lymph  node sampling sentinel-non-sentinel Nipple sparing mastectomy and multiple lymph node resections. Grade of carcinoma: Intermediate grade Necrosis: Present, focal Estimated tumor size: (gross measurement): 1.0 cm Treatment effect: N/A Distance to closest margin: Greater than 0.2 cm to all margins Breast prognostic profile: Case [id number] Estrogen receptor: 100%, strong staining intensity Progesterone receptor: 75%, moderate staining intensity Lymph nodes: Examined: 2 Sentinel 1 Non-sentinel 1 of 3 FINAL for CODY, OLIGER ([id number]) Microscopic Comment(continued) 3 Total Lymph nodes with metastasis: 0 TNM: pTis, pN0 (JBK:kh 05-15-16)   Diagnosis 05/25/2016 Skin , Right mastectomy - BENIGN SKIN AND SUBCUTANEOUS TISSUE WITH HEMORRHAGE AND FIBROSIS. -  NO EVIDENCE OF MALIGNANCY.  RADIOGRAPHIC STUDIES: I have personally reviewed the radiological images as listed and agreed with the findings in the report. Nm Sentinel Node Inj-no Rpt (breast)  Result Date: 05/14/2016 CLINICAL DATA: right breast dcis Sulfur colloid was injected intradermally by the nuclear medicine technologist for breast cancer sentinel node localization.    ASSESSMENT & PLAN: 50 year-old perimenopausal woman, presented with screening discovered DCIS.  1. Right outer lower quadrant breast DCIS,  Intermediate grade, ER+ /PR+ -I discussed her the surgical pathology findings from her right mastectomy, which showed DCIS, intermediate grade. No invasive component. Surgical margins were negative. -Her DCIS has been cured by complete surgical resection. Any form of adjuvant therapy is preventive. -Given her strongly positive/negative ER and PR, young age, intermediate grade DCIS, I do recommend antiestrogen therapy with tamoxifen for 5 years, which decrease her risk of future breast cancer by ~40%. -She is tolerating tamoxifen well, we'll continue -We will continue beast cancer surveillance, including annual left  mammogram, and physical exam.   2. Anxiety  -Continue Xanax as needed . She'll follow-up with her primary care physician   Plan -Continue tamoxifen -I'll see her back in 3 months, with lab and exam  All questions were answered. The patient knows to call the clinic with any problems, questions or concerns. I spent 15 minutes counseling the patient face to face. The total time spent in the appointment was 20 minutes and more than 50% was on counseling.     Truitt Merle, MD 06/11/2016 1:52 PM

## 2016-06-12 ENCOUNTER — Ambulatory Visit: Payer: Commercial Managed Care - PPO | Admitting: Physical Therapy

## 2016-06-14 ENCOUNTER — Ambulatory Visit: Payer: Commercial Managed Care - PPO

## 2016-06-15 ENCOUNTER — Other Ambulatory Visit: Payer: Self-pay | Admitting: *Deleted

## 2016-06-15 DIAGNOSIS — C50511 Malignant neoplasm of lower-outer quadrant of right female breast: Secondary | ICD-10-CM

## 2016-06-16 ENCOUNTER — Telehealth: Payer: Self-pay | Admitting: Hematology

## 2016-06-16 NOTE — Telephone Encounter (Signed)
APPOINTMENT CONF WITH PATIENT. 06/16/16

## 2016-06-19 ENCOUNTER — Ambulatory Visit: Payer: Commercial Managed Care - PPO | Admitting: Physical Therapy

## 2016-06-21 ENCOUNTER — Ambulatory Visit: Payer: Commercial Managed Care - PPO | Admitting: Physical Therapy

## 2016-06-21 DIAGNOSIS — M79621 Pain in right upper arm: Secondary | ICD-10-CM

## 2016-06-21 DIAGNOSIS — M25611 Stiffness of right shoulder, not elsewhere classified: Secondary | ICD-10-CM | POA: Diagnosis not present

## 2016-06-21 DIAGNOSIS — R222 Localized swelling, mass and lump, trunk: Secondary | ICD-10-CM

## 2016-06-21 NOTE — Therapy (Signed)
[place], [place], [address] Phone: [phone]   Fax:  [phone]  Physical Therapy Treatment  Patient Details  Name: DETTE Floyd MRN: [id number] Date of Birth: Aug 09, 1966 Referring Provider: Dr. Iran Planas   Encounter Date: 06/21/2016      PT End of Session - 06/21/16 1731    Visit Number 2   Number of Visits 9   Date for PT Re-Evaluation 07/13/16   PT Start Time 1605   PT Stop Time 1650   PT Time Calculation (min) 45 min   Activity Tolerance Patient tolerated treatment well   Behavior During Therapy [facility] for tasks assessed/performed      Past Medical History:  Diagnosis Date  . Anxiety   . Depression   . Hypothyroidism   . Thyroid disease     Past Surgical History:  Procedure Laterality Date  . BREAST RECONSTRUCTION WITH PLACEMENT OF TISSUE EXPANDER AND FLEX HD (ACELLULAR HYDRATED DERMIS) Right 05/14/2016   Procedure: RIGHT BREAST RECONSTRUCTION WITH PLACEMENT OF TISSUE EXPANDER AND POSSIBLE ACELLULAR HYDRATED DERMIS;  Surgeon: Irene Limbo, MD;  Location: [place];  Service: Plastics;  Laterality: Right;  . INCISION AND DRAINAGE OF WOUND Right 05/25/2016   Procedure: DEBRIDEMENT RIGHT MASTECTOMY FLAP;  Surgeon: Irene Limbo, MD;  Location: [place];  Service: Plastics;  Laterality: Right;  . NIPPLE SPARING MASTECTOMY/SENTINAL LYMPH NODE BIOPSY/RECONSTRUCTION/PLACEMENT OF TISSUE EXPANDER Right 05/14/2016   Procedure: RIGHT NIPPLE SPARING MASTECTOMY WITH SENTINAL LYMPH NODE BIOPSY ;  Surgeon: Autumn Messing III, MD;  Location: [place];  Service: General;  Laterality: Right;  . REMOVAL OF TISSUE EXPANDER Right 05/25/2016   Procedure: REMOVAL OF TISSUE EXPANDER;  Surgeon: Irene Limbo, MD;  Location: [place];  Service: Plastics;  Laterality: Right;  . TONSILECTOMY, ADENOIDECTOMY, BILATERAL MYRINGOTOMY AND TUBES    . TONSILLECTOMY     Adenoids, x2     There were no vitals filed for this visit.      Subjective Assessment - 06/21/16 1614    Subjective Still not carrying heavy things with left arm .  Still has funny feeling in back of left arm .  Pt will see Dr. Iran Planas next week to discuss when another expander is placed    Pertinent History pt has nipple sparing mastectomy with 2 lymph nodes removed july 17 with immediate expander placement.  She developed tissue necrosis and had expander and all tissue removed on july 28.  She has not had chemo and will not have to have radiation    Currently in Pain? No/denies            [facility] PT Assessment - 06/21/16 0001      AROM   Right Shoulder Flexion 165 Degrees   Right Shoulder ABduction 170 Degrees                     [facility] Adult PT Treatment/Exercise - 06/21/16 0001      Self-Care   Self-Care Other Self-Care Comments   Other Self-Care Comments  educated in lymphedema risk reduction practices, issued klosetraining weblink for lymphedema education session, issued flyer for ABC class, issued flyer for [employer] and [employer], issued flyer for where to get class one compression sleeve and gauntlet      Lumbar Exercises: Supine   Ab Set 5 reps   Heel Slides 5 reps   Bent Knee Raise 5 reps   Other Supine Lumbar Exercises pelvic tilt with pelvic  floor pull up     Shoulder Exercises: Supine   Horizontal ABduction Strengthening;Both;5 reps;Theraband   Theraband Level (Shoulder Horizontal ABduction) Level 1 (Yellow)   External Rotation Strengthening;Both;5 reps;Theraband   Theraband Level (Shoulder External Rotation) Level 1 (Yellow)   Flexion Strengthening;Both;5 reps;Theraband  wide and narrow grip   Theraband Level (Shoulder Flexion) Level 1 (Yellow)   Other Supine Exercises diagonal elevation "sash" 5 reps with yellow theraband with each arm                 PT Education - 06/21/16 1730    Education provided Yes   Education Details see self care  section, home exercise    Person(s) Educated Patient   Methods Explanation;Demonstration;Handout   Comprehension Verbalized understanding;Returned demonstration                [facility] - 06/21/16 1733      CC Long Term Goal  #1   Title Patient with verbalize an understanding of lymphedema risk reduction precautions   Status Achieved     CC Long Term Goal  #2   Title Patient will improve left shoulder abduction to 140 degrees so that she can perform her daily activities of  overhead reaching: homemaking and tasks of cooking and cleaning   Baseline 113 on 06/06/2016, 170 on 06/21/2016   Status Achieved     CC Long Term Goal  #3   Title Patient will improve shoulder flexion range of motion to 140 degrees to perform activities of daily living and household chores with greater ease   Baseline 130 on 06/06/2016, 165 on 06/21/2016   Status Achieved     CC Long Term Goal  #4   Title Pt will be independent in home program for upper body strengthening.    Time 1   Period Weeks   Status New     CC Long Term Goal  #5   Title Patient will report a decrease in pain by 50% so they can perform daily activities with greater ease   Status Achieved            Plan - 06/21/16 1732    Clinical Impression Statement Pt improved in pain and shoulder range of motion, but wants to continue to learn strenthening exercise.  Progress toward goals noted today   Clinical Impairments Affecting Rehab Potential 2 surgeries in the past month.    PT Treatment/Interventions Therapeutic activities;Therapeutic exercise;Taping;Manual techniques;DME Instruction;Neuromuscular re-education;Manual lymph drainage;Functional mobility training;Patient/family education;Passive range of motion;Scar mobilization;ADLs/Self Care Home Management   PT Next Visit Plan Instruct in Strength ABC program    Consulted and Agree with Plan of Care Patient      Patient will benefit from skilled therapeutic  intervention in order to improve the following deficits and impairments:  Decreased range of motion, Increased fascial restricitons, Impaired UE functional use, Decreased knowledge of precautions, Decreased skin integrity, Pain, Decreased scar mobility, Increased edema, Decreased strength, Postural dysfunction  Visit Diagnosis: Stiffness of right shoulder, not elsewhere classified  Pain in right upper arm  Localized swelling, mass and lump, trunk     Problem List Patient Active Problem List   Diagnosis Date Noted  . Ductal carcinoma in situ (DCIS) of right breast 05/14/2016  . 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 Owens Shark PT  Norwood Levo 06/21/2016, 5:37 PM  [place], [place], [address] Phone: [phone]  Fax:  [phone]  Name: Katelyn Floyd MRN: [id number] Date of Birth: February 09, 1966

## 2016-06-21 NOTE — Patient Instructions (Addendum)
[email or web address] Courses Online Strength After Breast Cancer Look at the right of the page for Lymphedema Education Session     Class one compression and gauntlet to wear when flying        Over Head Pull: Narrow Grip        On back, knees bent, feet flat, band across thighs, elbows straight but relaxed. Pull hands apart (start). Keeping elbows straight, bring arms up and over head, hands toward floor. Keep pull steady on band. Hold momentarily. Return slowly, keeping pull steady, back to start. Repeat _5-10__ times. Band color yellow______   Wide and narrow grip  Side Pull: Double Arm   On back, knees bent, feet flat. Arms perpendicular to body, shoulder level, elbows straight but relaxed. Pull arms out to sides, elbows straight. Resistance band comes across collarbones, hands toward floor. Hold momentarily. Slowly return to starting position. Repeat _5-10__ times. Band color yellow_____   Sash   On back, knees bent, feet flat, left hand on left hip, right hand above left. Pull right arm DIAGONALLY (hip to shoulder) across chest. Bring right arm along head toward floor. Hold momentarily. Slowly return to starting position. Repeat 5-10___ times. Do with left arm. Band color _yellow_____   Shoulder Rotation: Double Arm   On back, knees bent, feet flat, elbows tucked at sides, bent 90, hands palms up. Pull hands apart and down toward floor, keeping elbows near sides. Hold momentarily. Slowly return to starting position. Repeat 5-10___ times. Band color _yellow_____      PELVIC TILT  Lie on back, legs bent. Exhale, tilting top of pelvis back, pubic bone up, to flatten lower back. Inhale, rolling pelvis opposite way, top forward, pubic bone down, arch in back. Repeat __10__ times. Do __2__ sessions per day. Copyright  VHI. All rights reserved.     Lie with hips and knees bent. Slowly inhale, and then exhale. Pull navel toward spine and tighten pelvic floor. Hold  for __10_ seconds. Continue to breathe in and out during hold. Rest for _10__ seconds. Repeat __10_ times. Do __2-3_ times a day.   Copyright  VHI. All rights reserved.  Knee Fold   Lie on back, legs bent, arms by sides. Exhale, lifting knee to chest. Inhale, returning. Keep abdominals flat, navel to spine. Repeat __10__ times, alternating legs. Do __2__ sessions per day.  Copyright  VHI. All rights reserved.  Knee Drop   Keep pelvis stable. Without rotating hips, slowly drop knee to side, pause, return to center, bring knee across midline toward opposite hip. Feel obliques engaging. Repeat for ___10_ times each leg.  Copyright  VHI. All rights reserved.  Isometric Hold With Pelvic Floor (Hook-Lying)    Copyright  VHI. All rights reserved.  Heel Slide to Straight   Slide one leg down to straight. Return. Be sure pelvis does not rock forward, tilt, rotate, or tip to side. Do _10__ times. Restabilize pelvis. Repeat with other leg. Do __1-2_ sets, __2_ times per day.  [email or web address]   Copyright  VHI. All rights reserved.

## 2016-06-24 ENCOUNTER — Encounter: Payer: Self-pay | Admitting: Hematology

## 2016-06-26 ENCOUNTER — Ambulatory Visit: Payer: Commercial Managed Care - PPO | Admitting: Physical Therapy

## 2016-06-26 DIAGNOSIS — M25611 Stiffness of right shoulder, not elsewhere classified: Secondary | ICD-10-CM | POA: Diagnosis not present

## 2016-06-26 DIAGNOSIS — R222 Localized swelling, mass and lump, trunk: Secondary | ICD-10-CM

## 2016-06-26 DIAGNOSIS — M79621 Pain in right upper arm: Secondary | ICD-10-CM

## 2016-06-27 NOTE — Therapy (Addendum)
[place], [place], [address] Phone: [phone]   Fax:  [phone]  Physical Therapy Treatment  Patient Details  Name: Katelyn Floyd MRN: [id number] Date of Birth: [birth year] Referring Provider: Dr. Iran Planas   Encounter Date: 06/26/2016      PT End of Session - 06/27/16 1356    Visit Number 3   Number of Visits 9   Date for PT Re-Evaluation 07/13/16   PT Start Time 1600   PT Stop Time 1645   PT Time Calculation (min) 45 min   Activity Tolerance Patient tolerated treatment well   Behavior During Therapy [facility] for tasks assessed/performed      Past Medical History:  Diagnosis Date  . Anxiety   . Depression   . Hypothyroidism   . Thyroid disease     Past Surgical History:  Procedure Laterality Date  . BREAST RECONSTRUCTION WITH PLACEMENT OF TISSUE EXPANDER AND FLEX HD (ACELLULAR HYDRATED DERMIS) Right 05/14/2016   Procedure: RIGHT BREAST RECONSTRUCTION WITH PLACEMENT OF TISSUE EXPANDER AND POSSIBLE ACELLULAR HYDRATED DERMIS;  Surgeon: Irene Limbo, MD;  Location: [place];  Service: Plastics;  Laterality: Right;  . INCISION AND DRAINAGE OF WOUND Right 05/25/2016   Procedure: DEBRIDEMENT RIGHT MASTECTOMY FLAP;  Surgeon: Irene Limbo, MD;  Location: [place];  Service: Plastics;  Laterality: Right;  . NIPPLE SPARING MASTECTOMY/SENTINAL LYMPH NODE BIOPSY/RECONSTRUCTION/PLACEMENT OF TISSUE EXPANDER Right 05/14/2016   Procedure: RIGHT NIPPLE SPARING MASTECTOMY WITH SENTINAL LYMPH NODE BIOPSY ;  Surgeon: Autumn Messing III, MD;  Location: [place];  Service: General;  Laterality: Right;  . REMOVAL OF TISSUE EXPANDER Right 05/25/2016   Procedure: REMOVAL OF TISSUE EXPANDER;  Surgeon: Irene Limbo, MD;  Location: [place];  Service: Plastics;  Laterality: Right;  . TONSILECTOMY, ADENOIDECTOMY, BILATERAL MYRINGOTOMY AND TUBES    . TONSILLECTOMY     Adenoids, x2     There were no vitals filed for this visit.      Subjective Assessment - 06/27/16 1349    Subjective Pt reports she is doing well  She has an appt with MD and hopes to get expander placed soon    Pertinent History pt has nipple sparing mastectomy with 2 lymph nodes removed july 17 with immediate expander placement.  She developed tissue necrosis and had expander and all tissue removed on july 28.  She has not had chemo and will not have to have radiation    Patient Stated Goals better her range of motion and get rid of pain    Currently in Pain? No/denies                         [facility] Adult PT Treatment/Exercise - 06/27/16 0001      Self-Care   Other Self-Care Comments  Instructed in Strength ABC program and how to gradually progress resistance Pt ackowledged understanding      Lumbar Exercises: Supine   Bridge 5 reps   Other Supine Lumbar Exercises lower trunk rotation    Other Supine Lumbar Exercises quaduped opposite arm and leg raise      Knee/Hip Exercises: Stretches   Active Hamstring Stretch Both;2 reps   Sports administrator Both;2 reps   Piriformis Stretch Both;2 reps   Other Knee/Hip Stretches standing calf stretch     Knee/Hip Exercises: Standing   Heel Raises Both;5 reps   Hip Abduction Both;10 reps     Shoulder Exercises: Supine  Other Supine Exercises chest press  2 sets of 10 with 1# weight      Shoulder Exercises: Standing   Row Strengthening;Both;10 reps;Weights   Row Weight (lbs) 1   Shoulder Elevation Strengthening;Both;10 reps   Other Standing Exercises tricep kickbacks with 1# 10 reps with each arm      Shoulder Exercises: Stretch   Cross Chest Stretch 2 reps   Wall Stretch - Flexion 2 reps   Wall Stretch - ABduction 2 reps;10 seconds   Other Shoulder Stretches triceps strech                 PT Education - 06/27/16 1350    Education provided Yes   Education Details Strength ABC program    Person(s) Educated Patient    Methods Explanation;Demonstration;Handout   Comprehension Verbalized understanding;Returned demonstration                [facility] Goals - 06/27/16 1413      CC Long Term Goal  #1   Title Patient with verbalize an understanding of lymphedema risk reduction precautions   Status Achieved     CC Long Term Goal  #2   Title Patient will improve left shoulder abduction to 140 degrees so that she can perform her daily activities of  overhead reaching: homemaking and tasks of cooking and cleaning   Baseline 113 on 06/06/2016, 170 on 06/21/2016   Status Achieved     CC Long Term Goal  #3   Title Patient will improve shoulder flexion range of motion to 140 degrees to perform activities of daily living and household chores with greater ease   Baseline 130 on 06/06/2016, 165 on 06/21/2016   Status Achieved     CC Long Term Goal  #4   Title Pt will be independent in home program for upper body strengthening.    Status Achieved     CC Long Term Goal  #5   Title Patient will report a decrease in pain by 50% so they can perform daily activities with greater ease   Status Achieved            Plan - 06/27/16 1356    Clinical Impression Statement Pt did extremely well with Strength AFter Breast Cancer program and was able to do all exercises with good form.  She states she had been an exerciser in the past before and she remembered some of the exercise techniques.  She understands  how to progress resistance and to do them with  no weights  after expander placaement until she received that  OK to progress.  Pt feels that she is ready to discharge from PT     Clinical Impairments Affecting Rehab Potential 2 surgeries in the past month.    PT Next Visit Plan Keep episode open to see if pt wants to keep last appointment, if not discharge from PT.    Consulted and Agree with Plan of Care Patient      Patient will benefit from skilled therapeutic intervention in order to improve the  following deficits and impairments:  Decreased range of motion, Increased fascial restricitons, Impaired UE functional use, Decreased knowledge of precautions, Decreased skin integrity, Pain, Decreased scar mobility, Increased edema, Decreased strength, Postural dysfunction  Visit Diagnosis: Stiffness of right shoulder, not elsewhere classified  Pain in right upper arm  Localized swelling, mass and lump, trunk     Problem List Patient Active Problem List   Diagnosis Date Noted  .  Ductal carcinoma in situ (DCIS) of right breast 05/14/2016  . Breast cancer of lower-outer quadrant of right female breast ([place]) 03/09/2016     PHYSICAL THERAPY DISCHARGE SUMMARY  Visits from Start of Care: 3  Current functional level related to goals / functional outcomes: Pt cancelled final scheduled appt. Pt feels that she knows how to care for her shoulder    Remaining deficits: none  Education / Equipment: Home exercise  Plan: Patient agrees to discharge.  Patient goals were met. Patient is being discharged due to meeting the stated rehab goals.  ?????       Donato Heinz. Owens Shark PT  Norwood Levo 06/27/2016, 2:14 PM  [place], [place], [address] Phone: [phone]   Fax:  [phone]  Name: SHARVI MOONEYHAN MRN: [id number] Date of Birth: 12-May-1966

## 2016-06-28 ENCOUNTER — Ambulatory Visit: Payer: Commercial Managed Care - PPO | Admitting: Physical Therapy

## 2016-08-15 ENCOUNTER — Encounter: Payer: Commercial Managed Care - PPO | Admitting: Adult Health

## 2016-08-15 ENCOUNTER — Telehealth: Payer: Self-pay | Admitting: Adult Health

## 2016-08-15 NOTE — Telephone Encounter (Signed)
Patient called to cancel 10/18 appointment. Didn't remember the time for the appointment. Patient did not feel like she needed the appointment. Does not wish to reschedule appointment.

## 2016-09-10 ENCOUNTER — Other Ambulatory Visit (HOSPITAL_BASED_OUTPATIENT_CLINIC_OR_DEPARTMENT_OTHER): Payer: Commercial Managed Care - PPO

## 2016-09-10 ENCOUNTER — Ambulatory Visit (HOSPITAL_BASED_OUTPATIENT_CLINIC_OR_DEPARTMENT_OTHER): Payer: Commercial Managed Care - PPO | Admitting: Hematology

## 2016-09-10 ENCOUNTER — Encounter: Payer: Self-pay | Admitting: Hematology

## 2016-09-10 VITALS — BP 119/63 | HR 71 | Temp 98.4°F | Resp 16 | Ht 60.0 in | Wt 162.1 lb

## 2016-09-10 DIAGNOSIS — F419 Anxiety disorder, unspecified: Secondary | ICD-10-CM | POA: Diagnosis not present

## 2016-09-10 DIAGNOSIS — D0511 Intraductal carcinoma in situ of right breast: Secondary | ICD-10-CM

## 2016-09-10 DIAGNOSIS — R635 Abnormal weight gain: Secondary | ICD-10-CM

## 2016-09-10 LAB — COMPREHENSIVE METABOLIC PANEL
ALT: 14 U/L (ref 0–55)
ANION GAP: 9 meq/L (ref 3–11)
AST: 14 U/L (ref 5–34)
Albumin: 3.5 g/dL (ref 3.5–5.0)
Alkaline Phosphatase: 112 U/L (ref 40–150)
BILIRUBIN TOTAL: 0.34 mg/dL (ref 0.20–1.20)
BUN: 12.8 mg/dL (ref 7.0–26.0)
CHLORIDE: 102 meq/L (ref 98–109)
CO2: 27 meq/L (ref 22–29)
CREATININE: 0.7 mg/dL (ref 0.6–1.1)
Calcium: 9.3 mg/dL (ref 8.4–10.4)
EGFR: >90 mL/min/{1.73_m2} (ref >90)
GLUCOSE: 77 mg/dL (ref 70–140)
Potassium: 4.2 mEq/L (ref 3.5–5.1)
SODIUM: 138 meq/L (ref 136–145)
TOTAL PROTEIN: 7.3 g/dL (ref 6.4–8.3)

## 2016-09-10 LAB — CBC WITH DIFFERENTIAL/PLATELET
BASO%: 0.5 % (ref 0.0–2.0)
Basophils Absolute: 0 10*3/uL (ref 0.0–0.1)
EOS%: 1.2 % (ref 0.0–7.0)
Eosinophils Absolute: 0.1 10*3/uL (ref 0.0–0.5)
HCT: 40.4 % (ref 34.8–46.6)
HGB: 13.3 g/dL (ref 11.6–15.9)
LYMPH%: 38.1 % (ref 14.0–49.7)
MCH: 28.8 pg (ref 25.1–34.0)
MCHC: 32.9 g/dL (ref 31.5–36.0)
MCV: 87.6 fL (ref 79.5–101.0)
MONO#: 0.5 10*3/uL (ref 0.1–0.9)
MONO%: 7.4 % (ref 0.0–14.0)
NEUT%: 52.8 % (ref 38.4–76.8)
NEUTROS ABS: 3.6 10*3/uL (ref 1.5–6.5)
Platelets: 252 10*3/uL (ref 145–400)
RBC: 4.61 10*6/uL (ref 3.70–5.45)
RDW: 12.1 % (ref 11.2–14.5)
WBC: 6.8 10*3/uL (ref 3.9–10.3)
lymph#: 2.6 10*3/uL (ref 0.9–3.3)

## 2016-09-10 NOTE — Progress Notes (Signed)
[place]  [phone] [phone]  Clinic Follow up Note   Patient Care Team: Lawerance Cruel, MD as PCP - General (Family Medicine) Autumn Messing III, MD as Consulting Physician (General Surgery) 09/10/2016   CHIEF COMPLAINTS:  Follow up right breast DCIS    Oncology History   Breast cancer of lower-outer quadrant of right female breast [facility])   Staging form: Breast, AJCC 7th Edition     Clinical stage from 02/24/2016: Stage 0 (Tis (DCIS), N0, cM0(i+)) - Signed by Truitt Merle, MD on 03/09/2016       Ductal carcinoma in situ (DCIS) of right breast   02/20/2016 Mammogram    Diagnostic mammogram showed a 7 mm slightly suspicious group of calcifications within the OLQ of R breast, with a second smaller more faint group of calcification spanning 4 mm located 1.5 cm lateral.      02/24/2016 Initial Diagnosis    Breast cancer of lower-outer quadrant of right female breast ([place])      02/24/2016 Receptors her2    ER 100%+, PR 75%+      02/24/2016 Initial Biopsy    Right breast core needle biopsy showed ductal carcinoma in situ with calcification, intermediate to high-grade.      05/14/2016 Surgery    Right breast mastectomy and sentinel lymph node biopsy [facility])      05/14/2016 Pathologic Stage    Right breast mastectomy showed DCIS with calcification, intermediate grade, 1.0 cm, surgical margins were negative. 3 sentinel lymph nodes were negative.      04/2016 -  Anti-estrogen oral therapy    Tamoxifen 20 mg daily. Planned duration of therapy: 5 years        HISTORY OF PRESENTING ILLNESS:  Katelyn Floyd 50 y.o. female is here because of her newly diagnosed right breast DCIS. She is accompanied by her husband to the clinic today.  This was discovered by screening mammogram. She has been very compliant with screening mammogram, no prior abnormal mammogram findings or history of breast biopsy. She denies any palpable breast mass, skin change  or nipple discharge. Diagnostic mammogram and ultrasound on 02/20/2016 showed a 7 mm solid a suspicious group of calcification within the outer lower quadrant of right breast, with a second smaller group of calcification 4 mm located 1.5 cm lateral. She underwent core needle biopsy on 02/24/2016, which showed DCIS, intermediate to high-grade. ER and PR positive.  She is married, with 2 children. No family history of breast cancer. She is physically active, feels well well, denies any pain or other symptoms. No recent weight loss.  GYN HISTORY  Menarchal: 15 LMP: 05/2015  Contraceptive: no  HRT: she took for a few months  G4P2: she had micarriage and aborsion, 73 boy and 76 yo daughter   CURRENT THERAPY: Tamoxifen 57m daily started in  02/2016  INTERIM HISTORY: [name] for follow-up. She is doing well overall, and tolerates tamoxifen well well. Her only complaint is weight gain, she has gained about 7 pounds since she started tamoxifen 6 months ago. Her hot flash has improved, manageable, no other noticeable side effects. [facility]   MEDICAL HISTORY:  Past Medical History:  Diagnosis Date  . Anxiety   . Depression   . Hypothyroidism   . Thyroid disease     SURGICAL HISTORY: Past Surgical History:  Procedure Laterality Date  . BREAST RECONSTRUCTION WITH PLACEMENT OF TISSUE EXPANDER AND  FLEX HD (ACELLULAR HYDRATED DERMIS) Right 05/14/2016   Procedure: RIGHT BREAST RECONSTRUCTION WITH PLACEMENT OF TISSUE EXPANDER AND POSSIBLE ACELLULAR HYDRATED DERMIS;  Surgeon: Irene Limbo, MD;  Location: [place];  Service: Plastics;  Laterality: Right;  . INCISION AND DRAINAGE OF WOUND Right 05/25/2016   Procedure: DEBRIDEMENT RIGHT MASTECTOMY FLAP;  Surgeon: Irene Limbo, MD;  Location: [place];  Service: Plastics;  Laterality: Right;  . NIPPLE SPARING MASTECTOMY/SENTINAL LYMPH NODE  BIOPSY/RECONSTRUCTION/PLACEMENT OF TISSUE EXPANDER Right 05/14/2016   Procedure: RIGHT NIPPLE SPARING MASTECTOMY WITH SENTINAL LYMPH NODE BIOPSY ;  Surgeon: Autumn Messing III, MD;  Location: [place];  Service: General;  Laterality: Right;  . REMOVAL OF TISSUE EXPANDER Right 05/25/2016   Procedure: REMOVAL OF TISSUE EXPANDER;  Surgeon: Irene Limbo, MD;  Location: [place];  Service: Plastics;  Laterality: Right;  . TONSILECTOMY, ADENOIDECTOMY, BILATERAL MYRINGOTOMY AND TUBES    . TONSILLECTOMY     Adenoids, x2    SOCIAL HISTORY: Social History   Social History  . Marital status: Married    Spouse name: N/A  . Number of children: N/A  . Years of education: N/A   Occupational History  . Not on file.   Social History Main Topics  . Smoking status: Former Smoker    Packs/day: 1.00    Years: 25.00    Types: Cigarettes    Quit date: 09/28/1998  . Smokeless tobacco: Never Used  . Alcohol use No  . Drug use: No  . Sexual activity: Yes     Comment: husband had vasectomy   Other Topics Concern  . Not on file   Social History Narrative  . 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 Lung cancer Mother 87  . Melanoma Brother     ALLERGIES:  is allergic to no known allergies.  MEDICATIONS:  Current Outpatient Prescriptions  Medication Sig Dispense Refill  . ARMOUR THYROID 30 MG tablet Take 60-90 mg by mouth See admin instructions. 60 mg on Mon, Wed, and Fri.  90 mg all other days with breakfast  2  . buPROPion (WELLBUTRIN XL) 300 MG 24 hr tablet Take 300 mg by mouth every morning.     . citalopram (CELEXA) 20 MG tablet Take 30 mg by mouth every morning.     . tamoxifen (NOLVADEX) 20 MG tablet Take 1 tablet (20 mg total) by mouth every morning. 30 tablet 3  . traZODone (DESYREL) 100 MG tablet Take 150 mg by mouth at bedtime.    . ALPRAZolam (XANAX) 0.25 MG tablet Take 0.125-0.25  mg by mouth 3 (three) times daily as needed for anxiety.     Marland Kitchen aspirin EC 81 MG tablet Take 81 mg by mouth daily.    . Multiple Vitamin (MULTIVITAMIN) capsule Take 1 capsule by mouth every morning.      No current facility-administered medications for this visit.     REVIEW OF SYSTEMS:   Constitutional: Denies fevers, chills or abnormal night sweats Eyes: Denies blurriness of vision, double vision or watery eyes Ears, nose, mouth, throat, and face: Denies mucositis or sore throat Respiratory: Denies cough, dyspnea or wheezes Cardiovascular: Denies palpitation, chest discomfort or lower extremity swelling Gastrointestinal:  Denies nausea, heartburn or change in bowel habits Skin: Denies abnormal skin rashes Lymphatics: Denies new lymphadenopathy or easy bruising Neurological:Denies numbness, tingling or new weaknesses Behavioral/Psych: Mood is stable,  no new changes  All other systems were reviewed with the patient and are negative.  PHYSICAL EXAMINATION: ECOG PERFORMANCE STATUS: 0 - Asymptomatic  Vitals:   09/10/16 1511  BP: 119/63  Pulse: 71  Resp: 16  Temp: 98.4 F (36.9 C)   Filed Weights   09/10/16 1511  Weight: 162 lb 1.6 oz (73.5 kg)    GENERAL:alert, no distress and comfortable SKIN: skin color, texture, turgor are normal, no rashes or significant lesions EYES: normal, conjunctiva are pink and non-injected, sclera clear OROPHARYNX:no exudate, no erythema and lips, buccal mucosa, and tongue normal  NECK: supple, thyroid normal size, non-tender, without nodularity LYMPH:  no palpable lymphadenopathy in the cervical, axillary or inguinal LUNGS: clear to auscultation and percussion with normal breathing effort HEART: regular rate & rhythm and no murmurs and no lower extremity edema ABDOMEN:abdomen soft, non-tender and normal bowel sounds Musculoskeletal:no cyanosis of digits and no clubbing  PSYCH: alert & oriented x 3 with fluent speech NEURO: no focal motor/sensory  deficits Breasts: Right breast surgically absent. Surgical incision has healed well, no discharge or open wound. Palpation of the left breast and axilla revealed no obvious mass that I could appreciate.    LABORATORY DATA:  CBC Latest Ref Rng & Units 09/10/2016 05/08/2016  WBC 3.9 - 10.3 10e3/uL 6.8 6.2  Hemoglobin 11.6 - 15.9 g/dL 13.3 13.0  Hematocrit 34.8 - 46.6 % 40.4 38.9  Platelets 145 - 400 10e3/uL 252 267   CMP Latest Ref Rng & Units 05/08/2016  Glucose 65 - 99 mg/dL 106(H)  BUN 6 - 20 mg/dL 10  Creatinine 0.44 - 1.00 mg/dL 0.79  Sodium 135 - 145 mmol/L 136  Potassium 3.5 - 5.1 mmol/L 3.7  Chloride 101 - 111 mmol/L 103  CO2 22 - 32 mmol/L 26  Calcium 8.9 - 10.3 mg/dL 9.0     PATHOLOGY REPORT: Diagnosis 02/24/2016 Breast, right, needle core biopsy, lower outer - DUCTAL CARCINOMA IN SITU WITH CALCIFICATIONS. - SEE COMMENT.  The caricnoma appears intermediate to high grade. Estrogen receptor and progesterone receptor studies will be performed and the results reported separately. The results were called to [facility] on 02/27/16. (JBK:gt, 02/27/16)  Results: IMMUNOHISTOCHEMICAL AND MORPHOMETRIC ANALYSIS PERFORMED MANUALLY Estrogen Receptor: 100%, POSITIVE, STRONG STAINING INTENSITY Progesterone Receptor: 75%, POSITIVE, MODERATE STAINING INTENSITY  Diagnosis 05/14/2016 1. Soft tissue, biopsy, Right retro nipple - BENIGN BREAST PARENCHYMA. - THERE IS NO EVIDENCE OF MALIGNANCY. 2. Breast, simple mastectomy, Right - DUCTAL CARCINOMA IN SITU WITH CALCIFICATIONS, INTERMEDIATE GRADE, SPANNING 1.0 CM. - THE SURGICAL RESECTION MARGINS ARE NEGATIVE FOR CARCINOMA. - SEE ONCOLOGY TABLE BELOW. 3. Lymph node, sentinel, biopsy, Right Axillary #1 - THERE IS NO EVIDENCE OF CARCINOMA IN 1 OF 1 LYMPH NODE (0/1). 4. Lymph node, sentinel, biopsy, Right Axillary #2 - THERE IS NO EVIDENCE OF CARCINOMA IN 1 OF 1 LYMPH NODE (0/1). 5. Fatty tissue, Right Axillary Contents -  THERE IS NO EVIDENCE OF CARCINOMA IN 1 OF 1 LYMPH NODE (0/1). Microscopic Comment 2. BREAST, IN SITU CARCINOMA Specimen, including laterality: Right breast and right axillary lymph nodes Procedure (include lymph node sampling sentinel-non-sentinel Nipple sparing mastectomy and multiple lymph node resections. Grade of carcinoma: Intermediate grade Necrosis: Present, focal Estimated tumor size: (gross measurement): 1.0 cm Treatment effect: N/A Distance to closest margin: Greater than 0.2 cm to all margins Breast prognostic profile: Case [id number] Estrogen receptor: 100%, strong staining intensity Progesterone receptor: 75%, moderate staining intensity Lymph nodes: Examined: 2 Sentinel 1 Non-sentinel 1  of 3 FINAL for TYNSLEE, BOWLDS ([id number]) Microscopic Comment(continued) 3 Total Lymph nodes with metastasis: 0 TNM: pTis, pN0 (JBK:kh 05-15-16)   Diagnosis 05/25/2016 Skin , Right mastectomy - BENIGN SKIN AND SUBCUTANEOUS TISSUE WITH HEMORRHAGE AND FIBROSIS. - NO EVIDENCE OF MALIGNANCY.  RADIOGRAPHIC STUDIES: I have personally reviewed the radiological images as listed and agreed with the findings in the report. No results found.  ASSESSMENT & PLAN: 50 year-old perimenopausal woman, presented with screening discovered DCIS.  1. Right outer lower quadrant breast DCIS,  Intermediate grade, ER+ /PR+ -I discussed her the surgical pathology findings from her right mastectomy, which showed DCIS, intermediate grade. No invasive component. Surgical margins were negative. -Her DCIS has been cured by complete surgical resection. Any form of adjuvant therapy is preventive. -Given her strongly positive ER and PR, young age, intermediate grade DCIS, I do recommend antiestrogen therapy with tamoxifen for 5 years, which decrease her risk of future breast cancer by ~40%. -She is tolerating tamoxifen well, we'll continue, for total 5 years -Due to her coming right breast reconstruction  surgery, I recommend her to stop her tamoxifen a few days before surgery, and is restart a month after her surgery, to minimize her risk of thrombosis. -We will continue beast cancer surveillance, including annual left mammogram, and physical exam.  -She is scheduled to have right breast reconstruction surgery next month [facility]  2. Anxiety  -Continue Xanax as needed . She'll follow-up with her primary care physician   3. Weight gain -Possibly related to tamoxifen -I strongly encouraged her to watch her diet, avoid sweets, and exercise regularly.  Plan -Continue tamoxifen, hold a few days before her right breast reconstruction surgery, and restarted 1 month after her surgery, to decrease her risk of thrombosis. -I'll see her back in 4 months, with lab and exam, she is due for left breast mammogram in April 2018  All questions were answered. The patient knows to call the clinic with any problems, questions or concerns. I spent 15 minutes counseling the patient face to face. The total time spent in the appointment was 20 minutes and more than 50% was on counseling.     Truitt Merle, MD 09/10/2016 3:35 PM

## 2016-09-21 ENCOUNTER — Telehealth: Payer: Self-pay | Admitting: Hematology

## 2016-09-21 NOTE — Telephone Encounter (Signed)
Spoke with patient re March lab/fu.

## 2016-12-12 ENCOUNTER — Other Ambulatory Visit: Payer: Self-pay | Admitting: *Deleted

## 2016-12-12 DIAGNOSIS — D0511 Intraductal carcinoma in situ of right breast: Secondary | ICD-10-CM

## 2016-12-12 MED ORDER — TAMOXIFEN CITRATE 20 MG PO TABS: 20.0000 mg | ORAL_TABLET | ORAL | 0 refills | Status: DC

## 2017-01-08 ENCOUNTER — Telehealth: Payer: Self-pay

## 2017-01-08 DIAGNOSIS — D0511 Intraductal carcinoma in situ of right breast: Secondary | ICD-10-CM

## 2017-01-08 MED ORDER — TAMOXIFEN CITRATE 20 MG PO TABS: 20.0000 mg | ORAL_TABLET | ORAL | 3 refills | Status: DC

## 2017-01-08 NOTE — Telephone Encounter (Signed)
Incoming fax tamoxifen refill, per Dr Burr Medico increased to 90 day supply. Pt is appreciative of the 90 day supply. She states she is tolerating it well.

## 2017-01-10 ENCOUNTER — Telehealth: Payer: Self-pay | Admitting: Hematology

## 2017-01-10 NOTE — Telephone Encounter (Signed)
Patient called and needs to reschedule her appointment on 3/79 due to a conflicting appointment

## 2017-01-11 ENCOUNTER — Other Ambulatory Visit: Payer: Commercial Managed Care - PPO

## 2017-01-11 ENCOUNTER — Other Ambulatory Visit: Payer: Self-pay | Admitting: *Deleted

## 2017-01-11 ENCOUNTER — Ambulatory Visit: Payer: Commercial Managed Care - PPO | Admitting: Hematology

## 2017-01-11 DIAGNOSIS — D0511 Intraductal carcinoma in situ of right breast: Secondary | ICD-10-CM

## 2017-01-11 MED ORDER — TAMOXIFEN CITRATE 20 MG PO TABS: 20.0000 mg | ORAL_TABLET | ORAL | 3 refills | Status: DC

## 2017-01-15 ENCOUNTER — Telehealth: Payer: Self-pay | Admitting: Hematology

## 2017-01-15 NOTE — Telephone Encounter (Signed)
Scheduled appt per schedule message from [facility]. Per Rn Thu patient is already aware of appt no need to call.

## 2017-01-15 NOTE — Progress Notes (Signed)
Katelyn Floyd  [phone] Fax:([phone]) [phone]  Clinic Follow up Note   Patient Care Team: Katelyn Cruel, MD as PCP - General (Family Medicine) Katelyn Messing III, MD as Consulting Physician (General Surgery) Katelyn Few, MD as Consulting Physician (Obstetrics and Gynecology) 01/18/2017   CHIEF COMPLAINTS:  Follow up right breast DCIS    Oncology History   Breast cancer of lower-outer quadrant of right female breast [facility])   Staging form: Breast, AJCC 7th Edition     Clinical stage from 02/24/2016: Stage 0 (Tis (DCIS), N0, cM0(i+)) - Signed by Katelyn Merle, MD on 03/09/2016       Ductal carcinoma in situ (DCIS) of right breast   02/20/2016 Mammogram    Diagnostic mammogram showed a 7 mm slightly suspicious group of calcifications within the OLQ of R breast, with a second smaller more faint group of calcification spanning 4 mm located 1.5 cm lateral.      02/24/2016 Initial Diagnosis    Breast cancer of lower-outer quadrant of right female breast (Katelyn Floyd)      02/24/2016 Receptors her2    ER 100%+, PR 75%+      02/24/2016 Initial Biopsy    Right breast core needle biopsy showed ductal carcinoma in situ with calcification, intermediate to high-grade.      05/14/2016 Surgery    Right breast mastectomy and sentinel lymph node biopsy Katelyn Floyd)      05/14/2016 Pathologic Stage    Right breast mastectomy showed DCIS with calcification, intermediate grade, 1.0 cm, surgical margins were negative. 3 sentinel lymph nodes were negative.      04/2016 -  Anti-estrogen oral therapy    Tamoxifen 20 mg daily. Planned duration of therapy: 5 years        HISTORY OF PRESENTING ILLNESS:  Katelyn Floyd 51 y.o. female is here because of her newly diagnosed right breast DCIS. She is accompanied by her husband to the clinic today.  This was discovered by screening mammogram. She has been very compliant with screening mammogram, no prior abnormal mammogram findings or  history of breast biopsy. She denies any palpable breast mass, skin change or nipple discharge. Diagnostic mammogram and ultrasound on 02/20/2016 showed a 7 mm solid a suspicious group of calcification within the outer lower quadrant of right breast, with a second smaller group of calcification 4 mm located 1.5 cm lateral. She underwent core needle biopsy on 02/24/2016, which showed DCIS, intermediate to high-grade. ER and PR positive.  She is married, with 2 children. No family history of breast cancer. She is physically active, feels well well, denies any pain or other symptoms. No recent weight loss.  GYN HISTORY  Menarchal: 15 LMP: 05/2015  Contraceptive: no  HRT: she took for a Floyd months  G4P2: she had micarriage and abortion, 57 boy and 64 yo daughter   CURRENT THERAPY: Tamoxifen 47m daily started in 04/2016  INTERIM HISTORY: [place] for follow-[place]. She had right breast reconstruction on 10/04/16. She reports surgery went well. She denies any side effects from Tamoxifen. Denies joint pain/stiffness. She denies vaginal dryness.  MEDICAL HISTORY:  Past Medical History:  Diagnosis Date  . Anxiety   . Depression   . Hypothyroidism   . Thyroid disease     SURGICAL HISTORY: Past Surgical History:  Procedure Laterality Date  . BREAST RECONSTRUCTION WITH PLACEMENT OF TISSUE EXPANDER AND FLEX HD (ACELLULAR HYDRATED DERMIS) Right 05/14/2016   Procedure: RIGHT BREAST RECONSTRUCTION WITH PLACEMENT OF TISSUE EXPANDER AND POSSIBLE ACELLULAR  HYDRATED DERMIS;  Surgeon: Katelyn Limbo, MD;  Location: [place];  Service: Plastics;  Laterality: Right;  . INCISION AND DRAINAGE OF WOUND Right 05/25/2016   Procedure: DEBRIDEMENT RIGHT MASTECTOMY FLAP;  Surgeon: Katelyn Limbo, MD;  Location: [place];  Service: Plastics;  Laterality: Right;  . NIPPLE SPARING MASTECTOMY/SENTINAL LYMPH NODE BIOPSY/RECONSTRUCTION/PLACEMENT OF TISSUE EXPANDER Right 05/14/2016   Procedure: RIGHT NIPPLE  SPARING MASTECTOMY WITH SENTINAL LYMPH NODE BIOPSY ;  Surgeon: Katelyn Messing III, MD;  Location: [place];  Service: General;  Laterality: Right;  . REMOVAL OF TISSUE EXPANDER Right 05/25/2016   Procedure: REMOVAL OF TISSUE EXPANDER;  Surgeon: Katelyn Limbo, MD;  Location: [place];  Service: Plastics;  Laterality: Right;  . TONSILECTOMY, ADENOIDECTOMY, BILATERAL MYRINGOTOMY AND TUBES    . TONSILLECTOMY     Adenoids, x2    SOCIAL HISTORY: Social History   Social History  . Marital status: Married    Spouse name: N/A  . Number of children: N/A  . Years of education: N/A   Occupational History  . Not on file.   Social History Main Topics  . Smoking status: Former Smoker    Packs/day: 1.00    Years: 25.00    Types: Cigarettes    Quit date: 09/28/1998  . Smokeless tobacco: Never Used  . Alcohol use No  . Drug use: No  . Sexual activity: Yes     Comment: husband had vasectomy   Other Topics Concern  . Not on file   Social History Narrative  . 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 Lung cancer Mother 1  . Melanoma Brother     ALLERGIES:  is allergic to no known allergies.  MEDICATIONS:  Current Outpatient Prescriptions  Medication Sig Dispense Refill  . ALPRAZolam (XANAX) 0.25 MG tablet Take 0.125-0.25 mg by mouth 3 (three) times daily as needed for anxiety.     Francia Greaves THYROID 30 MG tablet Take 60-90 mg by mouth See admin instructions. 60 mg on Mon, Wed, and Fri.  90 mg all other days with breakfast  2  . aspirin EC 81 MG tablet Take 81 mg by mouth daily.    Marland Kitchen buPROPion (WELLBUTRIN XL) 300 MG 24 hr tablet Take 300 mg by mouth every morning.     . citalopram (CELEXA) 20 MG tablet Take 30 mg by mouth every morning.     . Multiple Vitamin (MULTIVITAMIN) capsule Take 1 capsule by mouth every morning.     . tamoxifen (NOLVADEX) 20 MG tablet Take 1 tablet (20 mg total)  by mouth every morning. 90 tablet 3  . traZODone (DESYREL) 100 MG tablet Take 150 mg by mouth at bedtime.     No current facility-administered medications for this visit.     REVIEW OF SYSTEMS:   Constitutional: Denies fevers, chills or abnormal night sweats Eyes: Denies blurriness of vision, double vision or watery eyes Ears, nose, mouth, throat, and face: Denies mucositis or sore throat Respiratory: Denies cough, dyspnea or wheezes Cardiovascular: Denies palpitation, chest discomfort or lower extremity swelling Gastrointestinal:  Denies nausea, heartburn or change in bowel habits Skin: Denies abnormal skin rashes Lymphatics: Denies new lymphadenopathy or easy bruising Neurological:Denies numbness, tingling or new weaknesses Behavioral/Psych: Mood is stable, no new changes  All other systems were reviewed with the patient and are negative.  PHYSICAL EXAMINATION: ECOG PERFORMANCE STATUS:  0 - Asymptomatic  Vitals:   01/18/17 1000  BP: (!) 104/48  Pulse: 71  Resp: 17  Temp: 98.4 F (36.9 C)   Filed Weights   01/18/17 1000  Weight: 140 lb 12.8 oz (63.9 kg)    GENERAL:alert, no distress and comfortable SKIN: skin color, texture, turgor are normal, no rashes or significant lesions EYES: normal, conjunctiva are pink and non-injected, sclera clear OROPHARYNX:no exudate, no erythema and lips, buccal mucosa, and tongue normal  NECK: supple, thyroid normal size, non-tender, without nodularity LYMPH:  no palpable lymphadenopathy in the cervical, axillary or inguinal LUNGS: clear to auscultation and percussion with normal breathing effort HEART: regular rate & rhythm and no murmurs and no lower extremity edema ABDOMEN:abdomen soft, non-tender and normal bowel sounds Musculoskeletal:no cyanosis of digits and no clubbing  PSYCH: alert & oriented x 3 with fluent speech NEURO: no focal motor/sensory deficits Breasts: Right breast reconstruction. Surgical incision well healed. 3-4 cm  firm mass in the UOQ right breast likely secondary to breast reconstruction. No adenopathy or other masses appreciated.  LABORATORY DATA:  CBC Latest Ref Rng & Units 01/18/2017 09/10/2016 05/08/2016  WBC 3.9 - 10.3 10e3/uL 4.1 6.8 6.2  Hemoglobin 11.6 - 15.9 g/dL 13.3 13.3 13.0  Hematocrit 34.8 - 46.6 % 39.9 40.4 38.9  Platelets 145 - 400 10e3/uL 195 252 267   CMP Latest Ref Rng & Units 01/18/2017 09/10/2016 05/08/2016  Glucose 70 - 140 mg/dl 87 77 106(H)  BUN 7.0 - 26.0 mg/dL 9.1 12.8 10  Creatinine 0.6 - 1.1 mg/dL 0.6 0.7 0.79  Sodium 136 - 145 mEq/L 140 138 136  Potassium 3.5 - 5.1 mEq/L 4.6 4.2 3.7  Chloride 101 - 111 mmol/L - - 103  CO2 22 - 29 mEq/L 27 27 26   Calcium 8.4 - 10.4 mg/dL 9.4 9.3 9.0  Total Protein 6.4 - 8.3 g/dL 7.0 7.3 -  Total Bilirubin 0.20 - 1.20 mg/dL 0.36 0.34 -  Alkaline Phos 40 - 150 U/L 97 112 -  AST 5 - 34 U/L 19 14 -  ALT 0 - 55 U/L 35 14 -     PATHOLOGY REPORT: Diagnosis 02/24/2016 Breast, right, needle core biopsy, lower outer - DUCTAL CARCINOMA IN SITU WITH CALCIFICATIONS. - SEE COMMENT.  The caricnoma appears intermediate to high grade. Estrogen receptor and progesterone receptor studies will be performed and the results reported separately. The results were called to [facility] on 02/27/16. (JBK:gt, 02/27/16)  Results: IMMUNOHISTOCHEMICAL AND MORPHOMETRIC ANALYSIS PERFORMED MANUALLY Estrogen Receptor: 100%, POSITIVE, STRONG STAINING INTENSITY Progesterone Receptor: 75%, POSITIVE, MODERATE STAINING INTENSITY  Diagnosis 05/14/2016 1. Soft tissue, biopsy, Right retro nipple - BENIGN BREAST PARENCHYMA. - THERE IS NO EVIDENCE OF MALIGNANCY. 2. Breast, simple mastectomy, Right - DUCTAL CARCINOMA IN SITU WITH CALCIFICATIONS, INTERMEDIATE GRADE, SPANNING 1.0 CM. - THE SURGICAL RESECTION MARGINS ARE NEGATIVE FOR CARCINOMA. - SEE ONCOLOGY TABLE BELOW. 3. Lymph node, sentinel, biopsy, Right Axillary #1 - THERE IS NO EVIDENCE OF  CARCINOMA IN 1 OF 1 LYMPH NODE (0/1). 4. Lymph node, sentinel, biopsy, Right Axillary #2 - THERE IS NO EVIDENCE OF CARCINOMA IN 1 OF 1 LYMPH NODE (0/1). 5. Fatty tissue, Right Axillary Contents - THERE IS NO EVIDENCE OF CARCINOMA IN 1 OF 1 LYMPH NODE (0/1). Microscopic Comment 2. BREAST, IN SITU CARCINOMA Specimen, including laterality: Right breast and right axillary lymph nodes Procedure (include lymph node sampling sentinel-non-sentinel Nipple sparing mastectomy and multiple lymph node resections. Grade of carcinoma: Intermediate grade Necrosis: Present,  focal Estimated tumor size: (gross measurement): 1.0 cm Treatment effect: N/A Distance to closest margin: Greater than 0.2 cm to all margins Breast prognostic profile: Case [id number] Estrogen receptor: 100%, strong staining intensity Progesterone receptor: 75%, moderate staining intensity Lymph nodes: Examined: 2 Sentinel 1 Non-sentinel 1 of 3 FINAL for Katelyn Floyd, Katelyn Floyd ([id number]) Microscopic Comment(continued) 3 Total Lymph nodes with metastasis: 0 TNM: pTis, pN0 (JBK:kh 05-15-16)   Diagnosis 05/25/2016 Skin , Right mastectomy - BENIGN SKIN AND SUBCUTANEOUS TISSUE WITH HEMORRHAGE AND FIBROSIS. - NO EVIDENCE OF MALIGNANCY.  RADIOGRAPHIC STUDIES: I have personally reviewed the radiological images as listed and agreed with the findings in the report. No results found.  ASSESSMENT & PLAN: 51 y.o. perimenopausal woman, presented with screening discovered DCIS.  1. Right outer lower quadrant breast DCIS,  Intermediate grade, ER+ /PR+ -I previously discussed her the surgical pathology findings from her right mastectomy, which showed DCIS, intermediate grade. No invasive component. Surgical margins were negative. -Her DCIS has been cured by complete surgical resection. Any form of adjuvant therapy is preventive. -Given her strongly positive ER and PR, young age, intermediate grade DCIS, I do recommend antiestrogen  therapy with tamoxifen for 5 years, which decrease her risk of future breast cancer by ~40%. -She is tolerating tamoxifen well, we'll continue, for total 5 years -We will continue beast cancer surveillance, including annual left mammogram, and physical exam.  -She had right breast reconstruction surgery at [facility] on 10/04/16.  -She will reschedule her left breast mammogram with her gynecologist in 2 weeks. She will send the reports to [place].  2. Anxiety  -Continue Xanax as needed . She'll follow-up with her primary care physician   3. Weight gain -Possibly related to tamoxifen -I strongly encouraged her to watch her diet, avoid sweets, and exercise regularly. -Since her last visit in November 2017, she has lost weight, she did weight watch program   Plan -Continue tamoxifen. -I'll see her back in 4 months, with lab and exam -Left breast mammogram in 2 weeks by her gynecologist. Results will be sent to [place]. -We will check if she had bone density scan on the next visit.  All questions were answered. The patient knows to call the clinic with any problems, questions or concerns. I spent 15 minutes counseling the patient face to face. The total time spent in the appointment was 20 minutes and more than 50% was on counseling.     Katelyn Merle, MD 01/18/2017 5:21 PM  This document serves as a record of services personally performed by Katelyn Merle, MD. It was created on her behalf by Darcus Austin, a trained medical scribe. The creation of this record is based on the scribe's personal observations and the provider's statements to them. This document has been checked and approved by the attending provider.

## 2017-01-18 ENCOUNTER — Encounter: Payer: Self-pay | Admitting: Hematology

## 2017-01-18 ENCOUNTER — Ambulatory Visit (HOSPITAL_BASED_OUTPATIENT_CLINIC_OR_DEPARTMENT_OTHER): Payer: Commercial Managed Care - PPO | Admitting: Hematology

## 2017-01-18 ENCOUNTER — Other Ambulatory Visit (HOSPITAL_BASED_OUTPATIENT_CLINIC_OR_DEPARTMENT_OTHER): Payer: Commercial Managed Care - PPO

## 2017-01-18 VITALS — BP 104/48 | HR 71 | Temp 98.4°F | Resp 17 | Ht 60.0 in | Wt 140.8 lb

## 2017-01-18 DIAGNOSIS — D0511 Intraductal carcinoma in situ of right breast: Secondary | ICD-10-CM | POA: Diagnosis not present

## 2017-01-18 DIAGNOSIS — F419 Anxiety disorder, unspecified: Secondary | ICD-10-CM | POA: Diagnosis not present

## 2017-01-18 DIAGNOSIS — R635 Abnormal weight gain: Secondary | ICD-10-CM

## 2017-01-18 LAB — CBC WITH DIFFERENTIAL/PLATELET
BASO%: 0.4 % (ref 0.0–2.0)
BASOS ABS: 0 10*3/uL (ref 0.0–0.1)
EOS ABS: 0 10*3/uL (ref 0.0–0.5)
EOS%: 1 % (ref 0.0–7.0)
HEMATOCRIT: 39.9 % (ref 34.8–46.6)
HEMOGLOBIN: 13.3 g/dL (ref 11.6–15.9)
LYMPH#: 1.6 10*3/uL (ref 0.9–3.3)
LYMPH%: 38.8 % (ref 14.0–49.7)
MCH: 29.3 pg (ref 25.1–34.0)
MCHC: 33.3 g/dL (ref 31.5–36.0)
MCV: 88 fL (ref 79.5–101.0)
MONO#: 0.4 10*3/uL (ref 0.1–0.9)
MONO%: 10.4 % (ref 0.0–14.0)
NEUT#: 2 10*3/uL (ref 1.5–6.5)
NEUT%: 49.4 % (ref 38.4–76.8)
PLATELETS: 195 10*3/uL (ref 145–400)
RBC: 4.54 10*6/uL (ref 3.70–5.45)
RDW: 12.9 % (ref 11.2–14.5)
WBC: 4.1 10*3/uL (ref 3.9–10.3)

## 2017-01-18 LAB — COMPREHENSIVE METABOLIC PANEL
ALBUMIN: 3.7 g/dL (ref 3.5–5.0)
ALT: 35 U/L (ref 0–55)
ANION GAP: 8 meq/L (ref 3–11)
AST: 19 U/L (ref 5–34)
Alkaline Phosphatase: 97 U/L (ref 40–150)
BILIRUBIN TOTAL: 0.36 mg/dL (ref 0.20–1.20)
BUN: 9.1 mg/dL (ref 7.0–26.0)
CO2: 27 meq/L (ref 22–29)
CREATININE: 0.6 mg/dL (ref 0.6–1.1)
Calcium: 9.4 mg/dL (ref 8.4–10.4)
Chloride: 106 mEq/L (ref 98–109)
EGFR: >90 mL/min/{1.73_m2} (ref >90)
GLUCOSE: 87 mg/dL (ref 70–140)
Potassium: 4.6 mEq/L (ref 3.5–5.1)
Sodium: 140 mEq/L (ref 136–145)
Total Protein: 7 g/dL (ref 6.4–8.3)

## 2017-01-30 ENCOUNTER — Telehealth: Payer: Self-pay | Admitting: Hematology

## 2017-01-30 NOTE — Telephone Encounter (Signed)
Spoke with patient and confirmed appointments with patient

## 2017-02-15 ENCOUNTER — Other Ambulatory Visit: Payer: Self-pay | Admitting: Obstetrics and Gynecology

## 2017-02-15 DIAGNOSIS — E2839 Other primary ovarian failure: Secondary | ICD-10-CM

## 2017-02-15 DIAGNOSIS — Z853 Personal history of malignant neoplasm of breast: Secondary | ICD-10-CM

## 2017-02-15 DIAGNOSIS — Z780 Asymptomatic menopausal state: Secondary | ICD-10-CM

## 2017-02-27 ENCOUNTER — Ambulatory Visit
Admission: RE | Admit: 2017-02-27 | Discharge: 2017-02-27 | Disposition: A | Discharge status: Home / Self Care | Payer: Commercial Managed Care - PPO | Source: Ambulatory Visit | Attending: Obstetrics and Gynecology | Admitting: Obstetrics and Gynecology

## 2017-02-27 ENCOUNTER — Other Ambulatory Visit: Payer: Self-pay | Admitting: Obstetrics and Gynecology

## 2017-02-27 DIAGNOSIS — Z853 Personal history of malignant neoplasm of breast: Secondary | ICD-10-CM

## 2017-02-27 DIAGNOSIS — Z1231 Encounter for screening mammogram for malignant neoplasm of breast: Secondary | ICD-10-CM

## 2017-02-27 DIAGNOSIS — Z780 Asymptomatic menopausal state: Secondary | ICD-10-CM

## 2017-02-27 DIAGNOSIS — E2839 Other primary ovarian failure: Secondary | ICD-10-CM

## 2017-02-27 HISTORY — DX: Malignant (primary) neoplasm, unspecified: C80.1

## 2017-03-08 ENCOUNTER — Inpatient Hospital Stay
Admission: RE | Admit: 2017-03-08 | Discharge: 2017-03-08 | Disposition: A | Discharge status: Home / Self Care | Payer: Commercial Managed Care - PPO | Source: Ambulatory Visit | Attending: Obstetrics and Gynecology | Admitting: Obstetrics and Gynecology

## 2017-05-24 ENCOUNTER — Other Ambulatory Visit: Payer: Commercial Managed Care - PPO

## 2017-05-24 ENCOUNTER — Encounter: Payer: Commercial Managed Care - PPO | Admitting: Hematology

## 2017-05-25 NOTE — Progress Notes (Signed)
This encounter was created in error - please disregard.

## 2017-05-28 ENCOUNTER — Telehealth: Payer: Self-pay

## 2017-05-28 NOTE — Telephone Encounter (Signed)
Called and left a message with new appts per 7/27 inbasket due to patients last no show  Katelyn Floyd

## 2017-06-13 NOTE — Progress Notes (Signed)
[place]  [phone] Fax:([phone]  Clinic Follow up Note   Patient Care Team: Lawerance Cruel, MD as PCP - General (Family Medicine) Jovita Kussmaul, MD as Consulting Physician (General Surgery) Brien Few, MD as Consulting Physician (Obstetrics and Gynecology) 06/17/2017   CHIEF COMPLAINTS:  Follow up right breast DCIS    Oncology History   Breast cancer of lower-outer quadrant of right female breast [facility])   Staging form: Breast, AJCC 7th Edition     Clinical stage from 02/24/2016: Stage 0 (Tis (DCIS), N0, cM0(i+)) - Signed by Truitt Merle, MD on 03/09/2016       Ductal carcinoma in situ (DCIS) of right breast   02/20/2016 Mammogram    Diagnostic mammogram showed a 7 mm slightly suspicious group of calcifications within the OLQ of R breast, with a second smaller more faint group of calcification spanning 4 mm located 1.5 cm lateral.      02/24/2016 Initial Diagnosis    Breast cancer of lower-outer quadrant of right female breast ([place])      02/24/2016 Receptors her2    ER 100%+, PR 75%+      02/24/2016 Initial Biopsy    Right breast core needle biopsy showed ductal carcinoma in situ with calcification, intermediate to high-grade.      05/14/2016 Surgery    Right breast mastectomy and sentinel lymph node biopsy [facility])      05/14/2016 Pathologic Stage    Right breast mastectomy showed DCIS with calcification, intermediate grade, 1.0 cm, surgical margins were negative. 3 sentinel lymph nodes were negative.      04/2016 -  Anti-estrogen oral therapy    Tamoxifen 20 mg daily. Planned duration of therapy: 5 years       02/27/2017 Mammogram    IMPRESSION: No mammographic evidence of malignancy.       HISTORY OF PRESENTING ILLNESS:  Katelyn Floyd 51 y.o. female is here because of her newly diagnosed right breast DCIS. She is accompanied by her husband to the clinic today.  This was discovered by screening mammogram. She has  been very compliant with screening mammogram, no prior abnormal mammogram findings or history of breast biopsy. She denies any palpable breast mass, skin change or nipple discharge. Diagnostic mammogram and ultrasound on 02/20/2016 showed a 7 mm solid a suspicious group of calcification within the outer lower quadrant of right breast, with a second smaller group of calcification 4 mm located 1.5 cm lateral. She underwent core needle biopsy on 02/24/2016, which showed DCIS, intermediate to high-grade. ER and PR positive.  She is married, with 2 children. No family history of breast cancer. She is physically active, feels well well, denies any pain or other symptoms. No recent weight loss.  GYN HISTORY  Menarchal: 15 LMP: 05/2015  Contraceptive: no  HRT: she took for a few months  G4P2: she had micarriage and abortion, 60 boy and 29 yo daughter   CURRENT THERAPY: Tamoxifen 81m daily started in 04/2016  INTERIM HISTORY: [place] for follow-up. She had surgery on August 1st, explaining it was the last part of her reconstruction including a lift and moving her nipple. She had stiches removed this morning. She has recovered well since surgery and is doing well overall. She denies soreness at the surgical site however, she does report itching. She stopped Tamoxifen the week prior to surgery and restarted last Monday. She denies joint issues. She has no concerns or complaints at this time. She does not need  any refills at this time.   MEDICAL HISTORY:  Past Medical History:  Diagnosis Date  . Anxiety   . Cancer [facility])    Breast Cancer 2017 Rt mastectomy   . Depression   . Hypothyroidism   . Thyroid disease     SURGICAL HISTORY: Past Surgical History:  Procedure Laterality Date  . BREAST RECONSTRUCTION WITH PLACEMENT OF TISSUE EXPANDER AND FLEX HD (ACELLULAR HYDRATED DERMIS) Right 05/14/2016   Procedure: RIGHT BREAST RECONSTRUCTION WITH PLACEMENT OF TISSUE EXPANDER AND POSSIBLE ACELLULAR  HYDRATED DERMIS;  Surgeon: Irene Limbo, MD;  Location: [place];  Service: Plastics;  Laterality: Right;  . INCISION AND DRAINAGE OF WOUND Right 05/25/2016   Procedure: DEBRIDEMENT RIGHT MASTECTOMY FLAP;  Surgeon: Irene Limbo, MD;  Location: [place];  Service: Plastics;  Laterality: Right;  . MASTECTOMY Right 2017  . NIPPLE SPARING MASTECTOMY/SENTINAL LYMPH NODE BIOPSY/RECONSTRUCTION/PLACEMENT OF TISSUE EXPANDER Right 05/14/2016   Procedure: RIGHT NIPPLE SPARING MASTECTOMY WITH SENTINAL LYMPH NODE BIOPSY ;  Surgeon: Autumn Messing III, MD;  Location: [place];  Service: General;  Laterality: Right;  . REMOVAL OF TISSUE EXPANDER Right 05/25/2016   Procedure: REMOVAL OF TISSUE EXPANDER;  Surgeon: Irene Limbo, MD;  Location: [place];  Service: Plastics;  Laterality: Right;  . TONSILECTOMY, ADENOIDECTOMY, BILATERAL MYRINGOTOMY AND TUBES    . TONSILLECTOMY     Adenoids, x2    SOCIAL HISTORY: Social History   Social History  . Marital status: Married    Spouse name: N/A  . Number of children: N/A  . Years of education: N/A   Occupational History  . Not on file.   Social History Main Topics  . Smoking status: Former Smoker    Packs/day: 1.00    Years: 25.00    Types: Cigarettes    Quit date: 09/28/1998  . Smokeless tobacco: Never Used  . Alcohol use No  . Drug use: No  . Sexual activity: Yes     Comment: husband had vasectomy   Other Topics Concern  . Not on file   Social History Narrative  . 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 Lung cancer Mother 53  . Melanoma Brother     ALLERGIES:  is allergic to no known allergies.  MEDICATIONS:  Current Outpatient Prescriptions  Medication Sig Dispense Refill  . ARMOUR THYROID 30 MG tablet Take 60-90 mg by mouth See admin instructions. 60 mg on Mon, Wed, and Fri.  90 mg all other days with breakfast  2    . aspirin EC 81 MG tablet Take 81 mg by mouth daily.    Marland Kitchen buPROPion (WELLBUTRIN XL) 300 MG 24 hr tablet Take 300 mg by mouth every morning.     . citalopram (CELEXA) 20 MG tablet Take 30 mg by mouth every morning.     . Multiple Vitamin (MULTIVITAMIN) capsule Take 1 capsule by mouth every morning.     . tamoxifen (NOLVADEX) 20 MG tablet Take 1 tablet (20 mg total) by mouth every morning. 90 tablet 3  . traZODone (DESYREL) 100 MG tablet Take 150 mg by mouth at bedtime.     No current facility-administered medications for this visit.     REVIEW OF SYSTEMS:   Constitutional: Denies fevers, chills or abnormal night sweats Eyes: Denies blurriness of vision, double vision or watery eyes Ears, nose, mouth, throat, and face: Denies  mucositis or sore throat Respiratory: Denies cough, dyspnea or wheezes Cardiovascular: Denies palpitation, chest discomfort or lower extremity swelling Gastrointestinal:  Denies nausea, heartburn or change in bowel habits Skin: Denies abnormal skin rashes (+) Itching at surgical site. Lymphatics: Denies new lymphadenopathy or easy bruising Neurological:Denies numbness, tingling or new weaknesses Behavioral/Psych: Mood is stable, no new changes  All other systems were reviewed with the patient and are negative.  PHYSICAL EXAMINATION: ECOG PERFORMANCE STATUS: 0 - Asymptomatic  Vitals:   06/17/17 1412  BP: (!) 111/35  Pulse: 73  Resp: 18  Temp: 98.9 F (37.2 C)  SpO2: 100%   Filed Weights   06/17/17 1412  Weight: 126 lb (57.2 kg)    GENERAL:alert, no distress and comfortable SKIN: skin color, texture, turgor are normal, no rashes or significant lesions EYES: normal, conjunctiva are pink and non-injected, sclera clear OROPHARYNX:no exudate, no erythema and lips, buccal mucosa, and tongue normal  NECK: supple, thyroid normal size, non-tender, without nodularity LYMPH:  no palpable lymphadenopathy in the cervical, axillary or inguinal LUNGS: clear to  auscultation and percussion with normal breathing effort HEART: regular rate & rhythm and no murmurs and no lower extremity edema ABDOMEN:abdomen soft, non-tender and normal bowel sounds Musculoskeletal:no cyanosis of digits and no clubbing  PSYCH: alert & oriented x 3 with fluent speech NEURO: no focal motor/sensory deficits Breasts: Surgical scar in left and right breast. Both are well healing with no erythema or discharge. No adenopathy or other masses appreciated.   LABORATORY DATA:  CBC Latest Ref Rng & Units 06/17/2017 01/18/2017 09/10/2016  WBC 3.9 - 10.3 10e3/uL 4.3 4.1 6.8  Hemoglobin 11.6 - 15.9 g/dL 11.7 13.3 13.3  Hematocrit 34.8 - 46.6 % 35.8 39.9 40.4  Platelets 145 - 400 10e3/uL 246 195 252   CMP Latest Ref Rng & Units 06/17/2017 01/18/2017 09/10/2016  Glucose 70 - 140 mg/dl 86 87 77  BUN 7.0 - 26.0 mg/dL 8.3 9.1 12.8  Creatinine 0.6 - 1.1 mg/dL 0.7 0.6 0.7  Sodium 136 - 145 mEq/L 140 140 138  Potassium 3.5 - 5.1 mEq/L 4.1 4.6 4.2  Chloride 101 - 111 mmol/L - - -  CO2 22 - 29 mEq/L 29 27 27   Calcium 8.4 - 10.4 mg/dL 9.1 9.4 9.3  Total Protein 6.4 - 8.3 g/dL 6.7 7.0 7.3  Total Bilirubin 0.20 - 1.20 mg/dL 0.35 0.36 0.34  Alkaline Phos 40 - 150 U/L 78 97 112  AST 5 - 34 U/L 14 19 14   ALT 0 - 55 U/L 13 35 14     PATHOLOGY REPORT: Diagnosis 02/24/2016 Breast, right, needle core biopsy, lower outer - DUCTAL CARCINOMA IN SITU WITH CALCIFICATIONS. - SEE COMMENT.  The caricnoma appears intermediate to high grade. Estrogen receptor and progesterone receptor studies will be performed and the results reported separately. The results were called to [facility] on 02/27/16. (JBK:gt, 02/27/16)  Results: IMMUNOHISTOCHEMICAL AND MORPHOMETRIC ANALYSIS PERFORMED MANUALLY Estrogen Receptor: 100%, POSITIVE, STRONG STAINING INTENSITY Progesterone Receptor: 75%, POSITIVE, MODERATE STAINING INTENSITY  Diagnosis 05/14/2016 1. Soft tissue, biopsy, Right retro nipple -  BENIGN BREAST PARENCHYMA. - THERE IS NO EVIDENCE OF MALIGNANCY. 2. Breast, simple mastectomy, Right - DUCTAL CARCINOMA IN SITU WITH CALCIFICATIONS, INTERMEDIATE GRADE, SPANNING 1.0 CM. - THE SURGICAL RESECTION MARGINS ARE NEGATIVE FOR CARCINOMA. - SEE ONCOLOGY TABLE BELOW. 3. Lymph node, sentinel, biopsy, Right Axillary #1 - THERE IS NO EVIDENCE OF CARCINOMA IN 1 OF 1 LYMPH NODE (0/1). 4. Lymph node, sentinel, biopsy, Right  Axillary #2 - THERE IS NO EVIDENCE OF CARCINOMA IN 1 OF 1 LYMPH NODE (0/1). 5. Fatty tissue, Right Axillary Contents - THERE IS NO EVIDENCE OF CARCINOMA IN 1 OF 1 LYMPH NODE (0/1). Microscopic Comment 2. BREAST, IN SITU CARCINOMA Specimen, including laterality: Right breast and right axillary lymph nodes Procedure (include lymph node sampling sentinel-non-sentinel Nipple sparing mastectomy and multiple lymph node resections. Grade of carcinoma: Intermediate grade Necrosis: Present, focal Estimated tumor size: (gross measurement): 1.0 cm Treatment effect: N/A Distance to closest margin: Greater than 0.2 cm to all margins Breast prognostic profile: Case [id number] Estrogen receptor: 100%, strong staining intensity Progesterone receptor: 75%, moderate staining intensity Lymph nodes: Examined: 2 Sentinel 1 Non-sentinel 1 of 3 FINAL for CORRIE, REDER ([id number]) Microscopic Comment(continued) 3 Total Lymph nodes with metastasis: 0 TNM: pTis, pN0 (JBK:kh 05-15-16)   Diagnosis 05/25/2016 Skin , Right mastectomy - BENIGN SKIN AND SUBCUTANEOUS TISSUE WITH HEMORRHAGE AND FIBROSIS. - NO EVIDENCE OF MALIGNANCY.  RADIOGRAPHIC STUDIES: I have personally reviewed the radiological images as listed and agreed with the findings in the report. No results found.   Screening mammogram 02/27/2017 IMPRESSION: No mammographic evidence of malignancy. A result letter of this screening mammogram will be mailed directly to the patient. RECOMMENDATION: Screening  mammogram in one year. (Code:SM-B-01Y)  ASSESSMENT & PLAN: 51 y.o. perimenopausal woman, presented with screening discovered DCIS.  1. Right outer lower quadrant breast DCIS,  Intermediate grade, ER+ /PR+ -I previously discussed her the surgical pathology findings from her right mastectomy, which showed DCIS, intermediate grade. No invasive component. Surgical margins were negative. -Her DCIS has been cured by complete surgical resection. Any form of adjuvant therapy is preventive. -Given her strongly positive ER and PR, young age, intermediate grade DCIS, I do recommend antiestrogen therapy with tamoxifen for 5 years, which decrease her risk of future breast cancer by ~40%. -She is tolerating tamoxifen well, we'll continue, for total 5 years -We will continue beast cancer surveillance, including annual left mammogram, and physical exam.  -She had right breast reconstruction surgery at [facility] on 10/04/16. She had additional reconstruction work done on 05/29/2017 at [facility]. She is recovering well. -Recent mammogram on 02/27/2017 shows no evidence of malignancy. Screening mammogram in one year recommended.  -Labs reviewed, CBC and CMP are normal. Her exam today was unremarkable, no clinical concern of breast cancer. -The patient has not had a bone density scan. I will refer the patient for DEXA at the breast center sometime this year.  2. Anxiety  -Continue Xanax as needed . She'll follow-up with her primary care physician   3. Weight gain -Possibly related to tamoxifen -I strongly encouraged her to watch her diet, avoid sweets, and exercise regularly. -Since her last visit in November 2017, she has lost weight, she did weight watch program   Plan -Continue tamoxifen. - I will refer the patient for DEXA at the breast center sometime this year. -I'll see her back in 6 months, with lab and exam.  All questions were answered. The patient knows to call the clinic with any problems, questions or  concerns. I spent 15 minutes counseling the patient face to face. The total time spent in the appointment was 20 minutes and more than 50% was on counseling.  This document serves as a record of services personally performed by Truitt Merle, MD. It was created on her behalf by Arlyce Harman, a trained medical scribe. The creation of this record is based on the scribe's personal observations and the provider's  statements to them. This document has been checked and approved by the attending provider.     Truitt Merle, MD 06/17/2017

## 2017-06-17 ENCOUNTER — Ambulatory Visit (HOSPITAL_BASED_OUTPATIENT_CLINIC_OR_DEPARTMENT_OTHER): Payer: Commercial Managed Care - PPO | Admitting: Hematology

## 2017-06-17 ENCOUNTER — Other Ambulatory Visit (HOSPITAL_BASED_OUTPATIENT_CLINIC_OR_DEPARTMENT_OTHER): Payer: Commercial Managed Care - PPO

## 2017-06-17 VITALS — BP 111/35 | HR 73 | Temp 98.9°F | Resp 18 | Ht 60.0 in | Wt 126.0 lb

## 2017-06-17 DIAGNOSIS — D0511 Intraductal carcinoma in situ of right breast: Secondary | ICD-10-CM

## 2017-06-17 DIAGNOSIS — F419 Anxiety disorder, unspecified: Secondary | ICD-10-CM

## 2017-06-17 DIAGNOSIS — E2839 Other primary ovarian failure: Secondary | ICD-10-CM

## 2017-06-17 LAB — COMPREHENSIVE METABOLIC PANEL
ALT: 13 U/L (ref 0–55)
ANION GAP: 6 meq/L (ref 3–11)
AST: 14 U/L (ref 5–34)
Albumin: 3.4 g/dL — ABNORMAL LOW (ref 3.5–5.0)
Alkaline Phosphatase: 78 U/L (ref 40–150)
BUN: 8.3 mg/dL (ref 7.0–26.0)
CALCIUM: 9.1 mg/dL (ref 8.4–10.4)
CHLORIDE: 105 meq/L (ref 98–109)
CO2: 29 meq/L (ref 22–29)
Creatinine: 0.7 mg/dL (ref 0.6–1.1)
EGFR: >90 mL/min/{1.73_m2} (ref >90)
Glucose: 86 mg/dl (ref 70–140)
POTASSIUM: 4.1 meq/L (ref 3.5–5.1)
Sodium: 140 mEq/L (ref 136–145)
Total Bilirubin: 0.35 mg/dL (ref 0.20–1.20)
Total Protein: 6.7 g/dL (ref 6.4–8.3)

## 2017-06-17 LAB — CBC WITH DIFFERENTIAL/PLATELET
BASO%: 0.5 % (ref 0.0–2.0)
BASOS ABS: 0 10*3/uL (ref 0.0–0.1)
EOS%: 1.4 % (ref 0.0–7.0)
Eosinophils Absolute: 0.1 10*3/uL (ref 0.0–0.5)
HEMATOCRIT: 35.8 % (ref 34.8–46.6)
HGB: 11.7 g/dL (ref 11.6–15.9)
LYMPH#: 1.9 10*3/uL (ref 0.9–3.3)
LYMPH%: 43.3 % (ref 14.0–49.7)
MCH: 30.1 pg (ref 25.1–34.0)
MCHC: 32.7 g/dL (ref 31.5–36.0)
MCV: 92 fL (ref 79.5–101.0)
MONO#: 0.5 10*3/uL (ref 0.1–0.9)
MONO%: 12.3 % (ref 0.0–14.0)
NEUT#: 1.8 10*3/uL (ref 1.5–6.5)
NEUT%: 42.5 % (ref 38.4–76.8)
PLATELETS: 246 10*3/uL (ref 145–400)
RBC: 3.89 10*6/uL (ref 3.70–5.45)
RDW: 12.9 % (ref 11.2–14.5)
WBC: 4.3 10*3/uL (ref 3.9–10.3)

## 2017-06-18 ENCOUNTER — Telehealth: Payer: Self-pay | Admitting: Hematology

## 2017-06-18 NOTE — Telephone Encounter (Signed)
Scheduled appt per 8/21 los - lab and f/u in 6 months - sent reminder letter in the mail.

## 2017-06-22 ENCOUNTER — Encounter: Payer: Self-pay | Admitting: Hematology

## 2017-08-29 ENCOUNTER — Ambulatory Visit
Admission: RE | Admit: 2017-08-29 | Discharge: 2017-08-29 | Disposition: A | Discharge status: Home / Self Care | Payer: PRIVATE HEALTH INSURANCE | Source: Ambulatory Visit | Attending: Hematology | Admitting: Hematology

## 2017-08-29 DIAGNOSIS — E2839 Other primary ovarian failure: Secondary | ICD-10-CM

## 2017-08-30 ENCOUNTER — Telehealth: Payer: Self-pay | Admitting: *Deleted

## 2017-08-30 NOTE — Telephone Encounter (Signed)
-----   Message from Truitt Merle, MD sent at 08/29/2017  9:24 PM EDT ----- Please let pt know the DEXA result, which is normal, thanks  Truitt Merle  08/29/2017

## 2017-08-30 NOTE — Telephone Encounter (Signed)
TCT patient to advise of DEXA  Results.  No answer but was able to leave message on identified voice mail. DEXA scan results were normal.

## 2017-08-30 NOTE — Telephone Encounter (Signed)
Called pt & informed of good DEXA report.

## 2017-12-19 ENCOUNTER — Ambulatory Visit: Payer: Commercial Managed Care - PPO | Admitting: Hematology

## 2017-12-19 ENCOUNTER — Other Ambulatory Visit: Payer: Commercial Managed Care - PPO

## 2018-03-10 ENCOUNTER — Other Ambulatory Visit: Payer: Self-pay

## 2018-03-10 ENCOUNTER — Telehealth: Payer: Self-pay

## 2018-03-10 DIAGNOSIS — D0511 Intraductal carcinoma in situ of right breast: Secondary | ICD-10-CM

## 2018-03-10 MED ORDER — TAMOXIFEN CITRATE 20 MG PO TABS: 20.0000 mg | ORAL_TABLET | ORAL | 0 refills | Status: DC

## 2018-03-10 NOTE — Telephone Encounter (Signed)
Patient calls stating that due to her current insurance Dr. Burr Medico is out of network. Trying to find another oncologist affiliated with [facility].  Patient is out of her Tamoxifen and is wondering if Dr. Burr Medico would be willing to refill it one last time.  Her [phone]  To be sent to [facility] if she will refill.

## 2018-06-17 ENCOUNTER — Telehealth: Payer: Self-pay | Admitting: Hematology

## 2018-06-17 ENCOUNTER — Other Ambulatory Visit: Payer: Self-pay

## 2018-06-17 DIAGNOSIS — D0511 Intraductal carcinoma in situ of right breast: Secondary | ICD-10-CM

## 2018-06-17 MED ORDER — TAMOXIFEN CITRATE 20 MG PO TABS: 20.0000 mg | ORAL_TABLET | ORAL | 0 refills | Status: AC

## 2018-06-17 NOTE — Telephone Encounter (Signed)
Patient's husband Shanon Brow calls stating they have an appointment 9/11 with Dr. Beaulah Corin at [facility], needs enough Tamoxifen until can see him.   Called patient back [facility] 30 day supply sent into [facility].

## 2018-06-17 NOTE — Telephone Encounter (Signed)
FAXED RECORDS TO [facility] [phone]

## 2019-01-08 ENCOUNTER — Other Ambulatory Visit: Payer: Self-pay | Admitting: Internal Medicine

## 2019-04-03 IMAGING — MG 2D DIGITAL SCREENING UNILATERAL LEFT MAMMOGRAM WITH CAD AND ADJU
6 series · 6 of 14 positions shown · non-contrast
Comparison: Previous exam(s).

CLINICAL DATA: Screening. History of right breast cancer status
post mastectomy in 0656.

EXAM:
2D DIGITAL SCREENING UNILATERAL LEFT MAMMOGRAM WITH CAD AND ADJUNCT
TOMO

[L MLO]
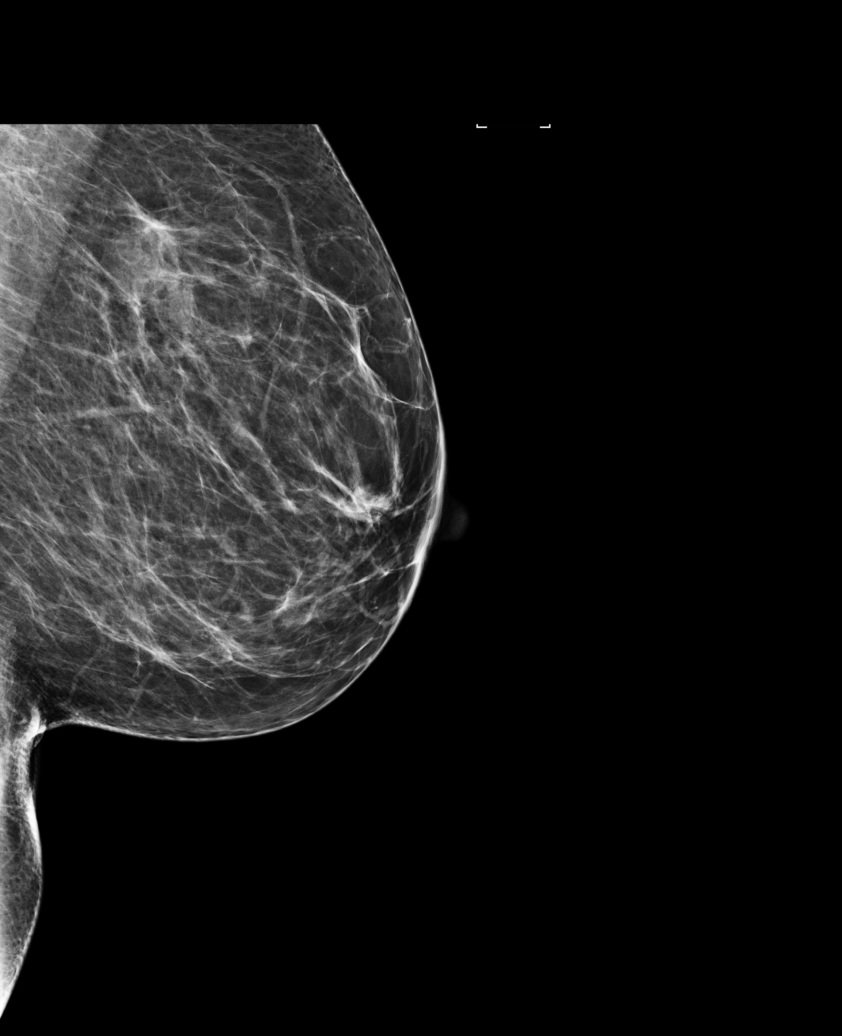

[L CC]
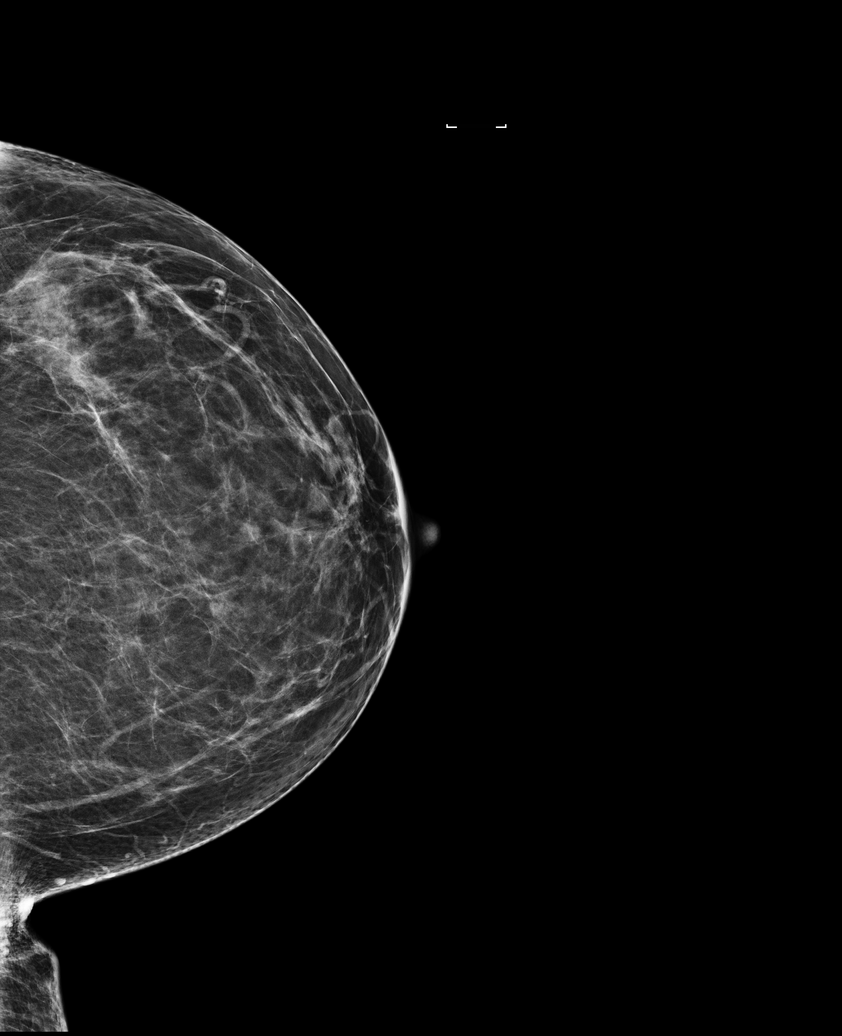

[L CC synth-2D]
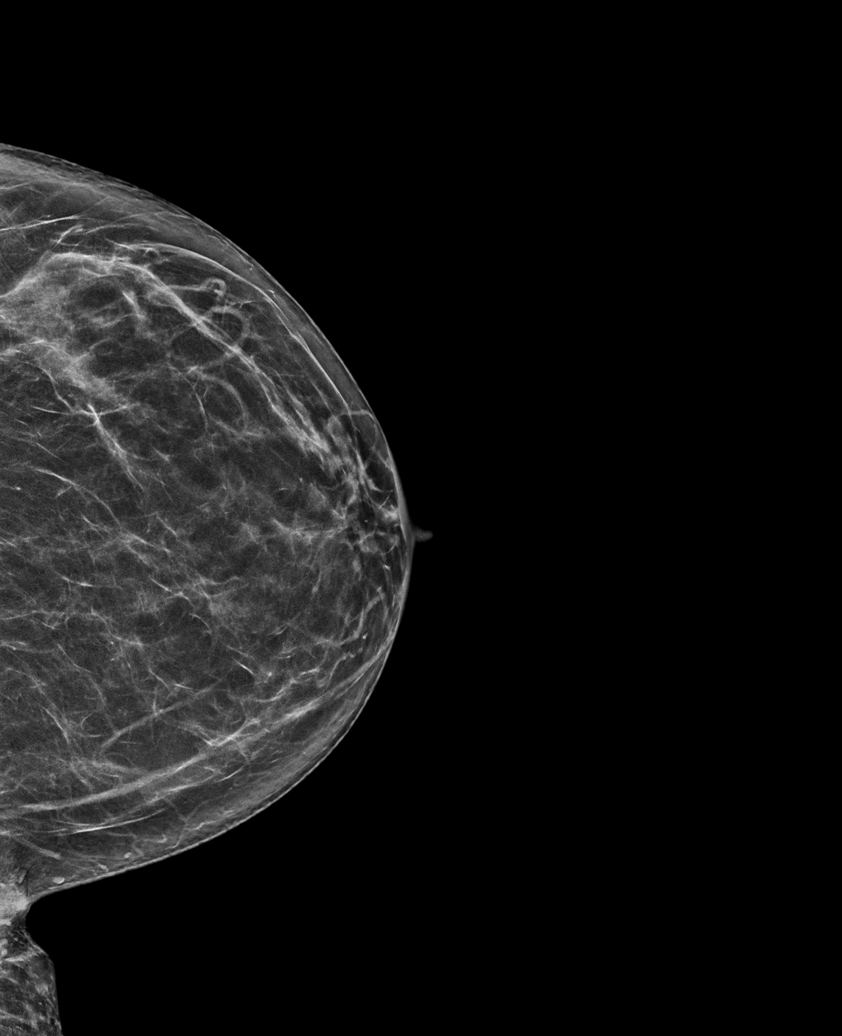

[L MLO synth-2D]
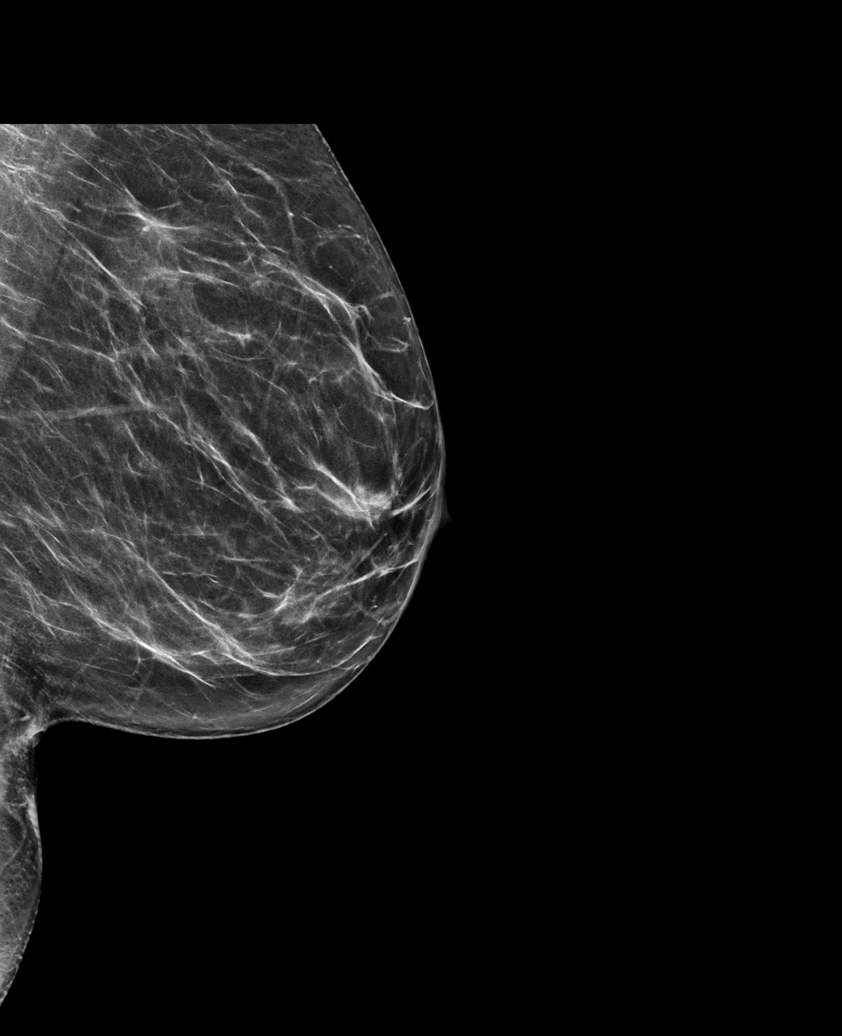

[L MLO tomo · tomo slice 35/68.0]
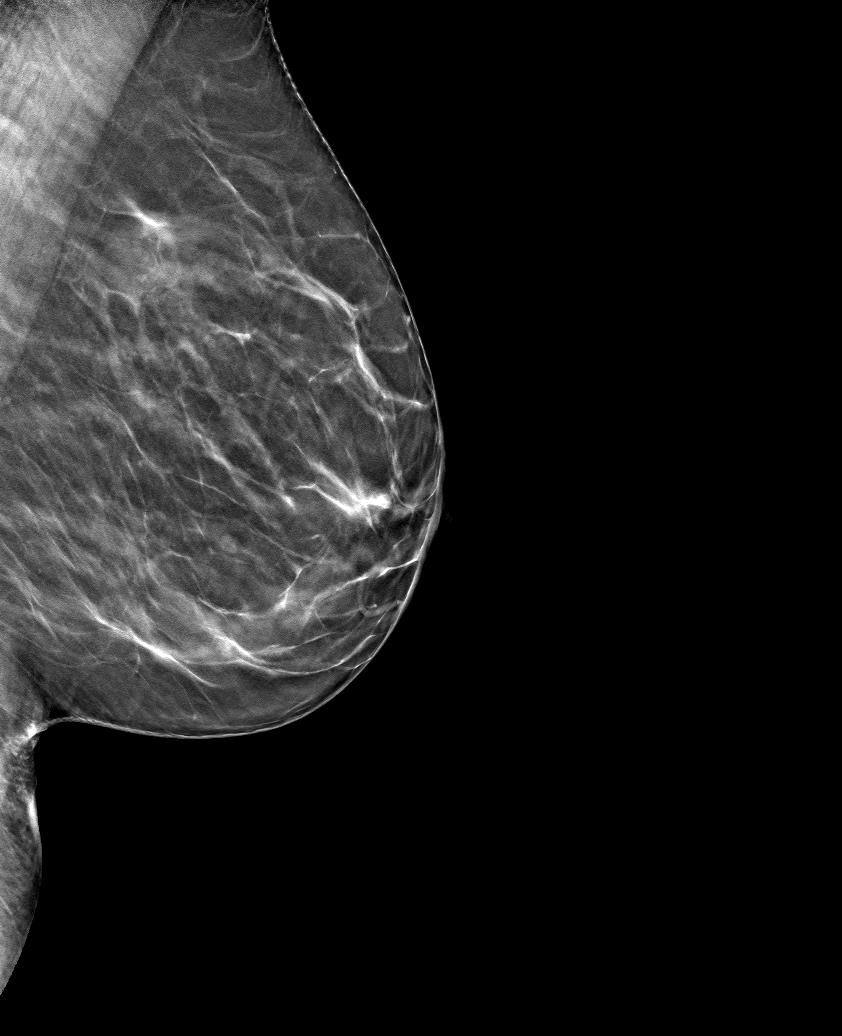

[L CC tomo · tomo slice 33/65.0]
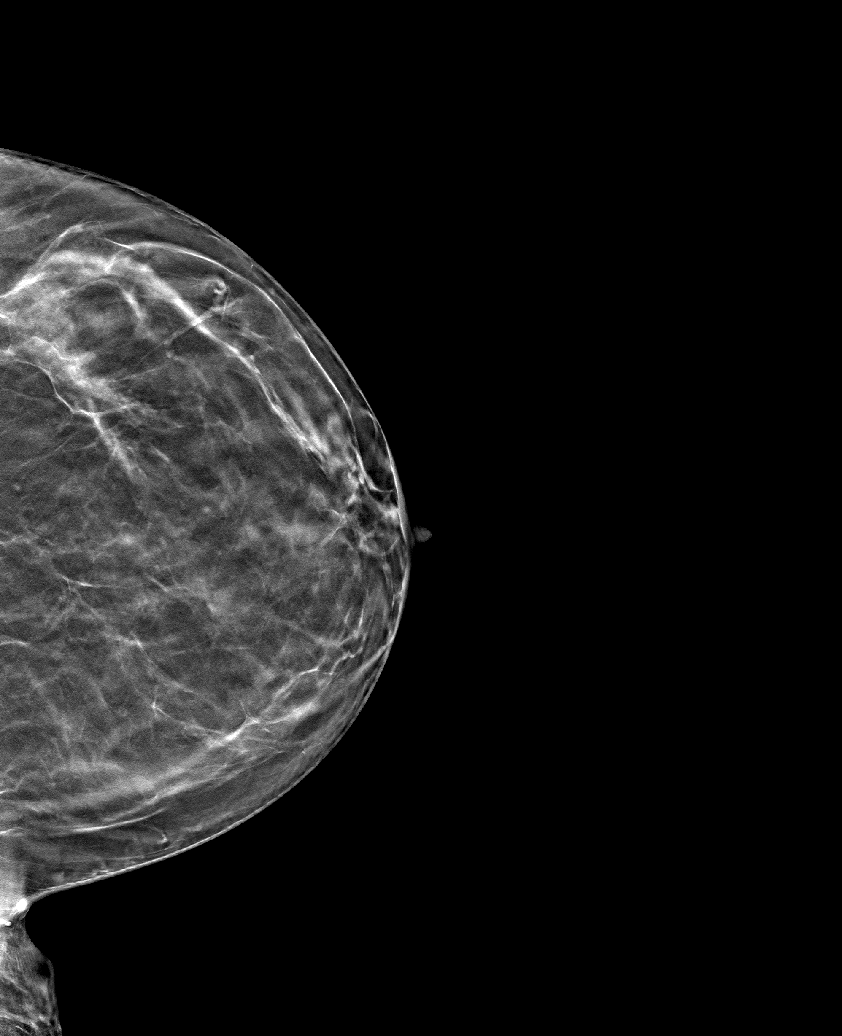

[6 of 14 positions shown; findings below may reference images not displayed]

ACR Breast Density Category b: There are scattered areas of
fibroglandular density.
FINDINGS: There are no findings suspicious for malignancy. Images were
processed with CAD.
IMPRESSION: No mammographic evidence of malignancy. A result letter of this
screening mammogram will be mailed directly to the patient.

RECOMMENDATION:
Screening mammogram in one year. (Code:JF-G-4JR)

BI-RADS CATEGORY  1: Negative.

## 2021-05-12 ENCOUNTER — Other Ambulatory Visit: Payer: Self-pay | Admitting: Obstetrics and Gynecology

## 2021-05-12 DIAGNOSIS — N959 Unspecified menopausal and perimenopausal disorder: Secondary | ICD-10-CM

## 2021-10-31 ENCOUNTER — Other Ambulatory Visit: Payer: PRIVATE HEALTH INSURANCE

## 2023-02-13 ENCOUNTER — Other Ambulatory Visit: Payer: Self-pay

## 2023-02-13 ENCOUNTER — Other Ambulatory Visit: Payer: Self-pay | Admitting: Obstetrics and Gynecology

## 2023-02-13 DIAGNOSIS — R2231 Localized swelling, mass and lump, right upper limb: Secondary | ICD-10-CM
